# Patient Record
Sex: Male | Born: 1950
Health system: Southern US, Community
[De-identification: ages and names within clinical notes are randomized; demographics above are authoritative.]

## PROBLEM LIST (undated history)

## (undated) DIAGNOSIS — K219 Gastro-esophageal reflux disease without esophagitis: Secondary | ICD-10-CM

## (undated) DIAGNOSIS — Z87442 Personal history of urinary calculi: Secondary | ICD-10-CM

## (undated) DIAGNOSIS — D649 Anemia, unspecified: Secondary | ICD-10-CM

## (undated) DIAGNOSIS — E785 Hyperlipidemia, unspecified: Secondary | ICD-10-CM

## (undated) HISTORY — PX: ESOPHAGOGASTRODUODENOSCOPY ENDOSCOPY: SHX5814

## (undated) HISTORY — PX: APPENDECTOMY: SHX54

## (undated) HISTORY — PX: UPPER GASTROINTESTINAL ENDOSCOPY: SHX188

## (undated) HISTORY — PX: TONSILLECTOMY: SUR1361

## (undated) HISTORY — PX: COLONOSCOPY: SHX174

## (undated) HISTORY — DX: Hyperlipidemia, unspecified: E78.5

## (undated) HISTORY — PX: KIDNEY STONE SURGERY: SHX686

---

## 2014-01-28 ENCOUNTER — Other Ambulatory Visit: Payer: Self-pay

## 2014-01-28 ENCOUNTER — Telehealth: Payer: Self-pay

## 2014-01-28 DIAGNOSIS — K31819 Angiodysplasia of stomach and duodenum without bleeding: Secondary | ICD-10-CM

## 2014-01-28 NOTE — Telephone Encounter (Signed)
EGD scheduled, pt instructed and medications reviewed.  Patient instructions mailed to home.  Patient to call with any questions or concerns.  

## 2014-01-28 NOTE — Telephone Encounter (Signed)
EGD APC 02/26/14

## 2014-02-12 ENCOUNTER — Encounter (HOSPITAL_COMMUNITY): Payer: Self-pay | Admitting: *Deleted

## 2014-02-26 ENCOUNTER — Encounter (HOSPITAL_COMMUNITY)
Admission: RE | Disposition: A | Discharge status: Home / Self Care | Payer: Self-pay | Source: Ambulatory Visit | Attending: Gastroenterology

## 2014-02-26 ENCOUNTER — Ambulatory Visit (HOSPITAL_COMMUNITY): Payer: BLUE CROSS/BLUE SHIELD | Admitting: Registered Nurse

## 2014-02-26 ENCOUNTER — Encounter (HOSPITAL_COMMUNITY): Payer: Self-pay

## 2014-02-26 ENCOUNTER — Telehealth: Payer: Self-pay

## 2014-02-26 ENCOUNTER — Ambulatory Visit (HOSPITAL_COMMUNITY)
Admission: RE | Admit: 2014-02-26 | Discharge: 2014-02-26 | Disposition: A | Discharge status: Home / Self Care | Payer: BLUE CROSS/BLUE SHIELD | Source: Ambulatory Visit | Attending: Gastroenterology | Admitting: Gastroenterology

## 2014-02-26 DIAGNOSIS — K219 Gastro-esophageal reflux disease without esophagitis: Secondary | ICD-10-CM | POA: Diagnosis not present

## 2014-02-26 DIAGNOSIS — Z87891 Personal history of nicotine dependence: Secondary | ICD-10-CM | POA: Diagnosis not present

## 2014-02-26 DIAGNOSIS — Z87442 Personal history of urinary calculi: Secondary | ICD-10-CM | POA: Insufficient documentation

## 2014-02-26 DIAGNOSIS — D649 Anemia, unspecified: Secondary | ICD-10-CM | POA: Insufficient documentation

## 2014-02-26 DIAGNOSIS — K31819 Angiodysplasia of stomach and duodenum without bleeding: Secondary | ICD-10-CM | POA: Insufficient documentation

## 2014-02-26 DIAGNOSIS — Z79899 Other long term (current) drug therapy: Secondary | ICD-10-CM | POA: Diagnosis not present

## 2014-02-26 HISTORY — DX: Personal history of urinary calculi: Z87.442

## 2014-02-26 HISTORY — PX: HOT HEMOSTASIS: SHX5433

## 2014-02-26 HISTORY — DX: Anemia, unspecified: D64.9

## 2014-02-26 HISTORY — PX: ESOPHAGOGASTRODUODENOSCOPY (EGD) WITH PROPOFOL: SHX5813

## 2014-02-26 HISTORY — DX: Gastro-esophageal reflux disease without esophagitis: K21.9

## 2014-02-26 SURGERY — ESOPHAGOGASTRODUODENOSCOPY (EGD) WITH PROPOFOL
Anesthesia: Monitor Anesthesia Care

## 2014-02-26 MED ORDER — MIDAZOLAM HCL 2 MG/2ML IJ SOLN
INTRAMUSCULAR | Status: AC
  Filled 2014-02-26: qty 2

## 2014-02-26 MED ORDER — PROPOFOL 10 MG/ML IV BOLUS
INTRAVENOUS | Status: AC
  Filled 2014-02-26: qty 20

## 2014-02-26 MED ORDER — MIDAZOLAM HCL 5 MG/5ML IJ SOLN
INTRAMUSCULAR | Status: DC | PRN
  Administered 2014-02-26: 2 mg via INTRAVENOUS

## 2014-02-26 MED ORDER — LACTATED RINGERS IV SOLN
INTRAVENOUS | Status: DC
  Administered 2014-02-26: 1000 mL via INTRAVENOUS

## 2014-02-26 MED ORDER — KETAMINE HCL 10 MG/ML IJ SOLN
INTRAMUSCULAR | Status: AC
  Filled 2014-02-26: qty 1

## 2014-02-26 MED ORDER — SODIUM CHLORIDE 0.9 % IV SOLN: INTRAVENOUS | Status: DC

## 2014-02-26 MED ORDER — PROPOFOL INFUSION 10 MG/ML OPTIME
INTRAVENOUS | Status: DC | PRN
  Administered 2014-02-26: 120 ug/kg/min via INTRAVENOUS

## 2014-02-26 MED ORDER — LIDOCAINE HCL (CARDIAC) 20 MG/ML IV SOLN
INTRAVENOUS | Status: DC | PRN
  Administered 2014-02-26: 100 mg via INTRAVENOUS

## 2014-02-26 MED ORDER — MIDAZOLAM HCL 2 MG/2ML IJ SOLN: 0.5000 mg | Freq: Once | INTRAMUSCULAR | Status: DC | PRN

## 2014-02-26 MED ORDER — LIDOCAINE HCL (CARDIAC) 20 MG/ML IV SOLN
INTRAVENOUS | Status: AC
  Filled 2014-02-26: qty 5

## 2014-02-26 MED ORDER — KETAMINE HCL 10 MG/ML IJ SOLN
INTRAMUSCULAR | Status: DC | PRN
  Administered 2014-02-26: 20 mg via INTRAVENOUS

## 2014-02-26 MED ORDER — ACETAMINOPHEN 325 MG PO TABS
325.0000 mg | ORAL_TABLET | ORAL | Status: DC | PRN
  Filled 2014-02-26: qty 2

## 2014-02-26 MED ORDER — MEPERIDINE HCL 100 MG/ML IJ SOLN: 6.2500 mg | INTRAMUSCULAR | Status: DC | PRN

## 2014-02-26 MED ORDER — FENTANYL CITRATE 0.05 MG/ML IJ SOLN: 25.0000 ug | INTRAMUSCULAR | Status: DC | PRN

## 2014-02-26 MED ORDER — PROMETHAZINE HCL 25 MG/ML IJ SOLN: 6.2500 mg | INTRAMUSCULAR | Status: DC | PRN

## 2014-02-26 MED ORDER — ACETAMINOPHEN 160 MG/5ML PO SOLN
325.0000 mg | ORAL | Status: DC | PRN
  Filled 2014-02-26: qty 20.3

## 2014-02-26 SURGICAL SUPPLY — 14 items

## 2014-02-26 NOTE — Op Note (Signed)
[facility] [place], [address]   ENDOSCOPY PROCEDURE REPORT  PATIENT: Charles Carr, Charles Carr  MR#: [id number] BIRTHDATE: [birth year] , 63  yrs. old GENDER: male ENDOSCOPIST: Milus Banister, MD REFERRED BY:  Kyra Leyland, M.D. PROCEDURE DATE:  02/26/2014 PROCEDURE:  EGD w/ ablation ASA CLASS:     Class II INDICATIONS:  IDA, recent EGD and colonoscopy with Dr.  Lyda Jester revealed GAVE; has required several iron infusions. MEDICATIONS: Monitored anesthesia care TOPICAL ANESTHETIC: none  DESCRIPTION OF PROCEDURE: After the risks benefits and alternatives of the procedure were thoroughly explained, informed consent was obtained.  The Pentax Gastroscope [id number] endoscope was introduced through the mouth and advanced to the second portion of the duodenum , Without limitations.  The instrument was slowly withdrawn as the mucosa was fully examined.  There was typical appearing, cherry red GAVE in distal stomach. Light touches with the gastroscope caused bleeding.  The area was treated with application of APC with good results.  The examination was otherwise normal.  Retroflexed views revealed no abnormalities. The scope was then withdrawn from the patient and the procedure completed.  COMPLICATIONS: There were no immediate complications.  ENDOSCOPIC IMPRESSION: There was typical appearing, cherry red GAVE in distal stomach. Light touches with the gastroscope caused bleeding.  The area was treated with application of APC with good results.  The examination was otherwise normal  RECOMMENDATIONS: My office will schedule repeat EGD for evaluation and repeat APC treatment if needed in 2 months.   eSigned:  Milus Banister, MD 02/26/2014 9:17 AM

## 2014-02-26 NOTE — Progress Notes (Signed)
Pt c/o fullness and discomfort in the mid epigastric area. Rates 4/10. Notified Dr. Ardis Hughs. Stated related to the gas given during the procedure. Pt encouraged to pass gas and increase mobility today. Also instructed to take tylenol prn. Pt verbalized understanding. SmondayRn

## 2014-02-26 NOTE — Telephone Encounter (Signed)
Copy sent to Dr Lyda Jester and staff message sent to set up procedure

## 2014-02-26 NOTE — Transfer of Care (Signed)
Immediate Anesthesia Transfer of Care Note  Patient: Charles Carr  Procedure(s) Performed: Procedure(s) with comments: ESOPHAGOGASTRODUODENOSCOPY (EGD) WITH PROPOFOL (N/A) - APC HOT HEMOSTASIS (ARGON PLASMA COAGULATION/BICAP) (N/A)  Patient Location: PACU and Endoscopy Unit  Anesthesia Type:MAC  Level of Consciousness: awake, alert , oriented and patient cooperative  Airway & Oxygen Therapy: Patient Spontanous Breathing and Patient connected to nasal cannula oxygen  Post-op Assessment: Report given to PACU RN  Post vital signs: Reviewed and stable  Complications: No apparent anesthesia complications

## 2014-02-26 NOTE — Discharge Instructions (Signed)
YOU HAD AN ENDOSCOPIC PROCEDURE TODAY: Refer to the procedure report that was given to you for any specific questions about what was found during the examination.  If the procedure report does not answer your questions, please call your gastroenterologist to clarify. ° °YOU SHOULD EXPECT: Some feelings of bloating in the abdomen. Passage of more gas than usual.  Walking can help get rid of the air that was put into your GI tract during the procedure and reduce the bloating. If you had a lower endoscopy (such as a colonoscopy or flexible sigmoidoscopy) you may notice spotting of blood in your stool or on the toilet paper.  ° °DIET: Your first meal following the procedure should be a light meal and then it is ok to progress to your normal diet.  A half-sandwich or bowl of soup is an example of a good first meal.  Heavy or fried foods are harder to digest and may make you feel nasueas or bloated.  Drink plenty of fluids but you should avoid alcoholic beverages for 24 hours. ° °ACTIVITY: Your care partner should take you home directly after the procedure.  You should plan to take it easy, moving slowly for the rest of the day.  You can resume normal activity the day after the procedure however you should NOT DRIVE or use heavy machinery for 24 hours (because of the sedation medicines used during the test).   ° °SYMPTOMS TO REPORT IMMEDIATELY  °A gastroenterologist can be reached at any hour.  Please call your doctor's office for any of the following symptoms: ° °· Following lower endoscopy (colonoscopy, flexible sigmoidoscopy) ° Excessive amounts of blood in the stool ° Significant tenderness, worsening of abdominal pains ° Swelling of the abdomen that is new, acute ° Fever of 100° or higher °· Following upper endoscopy (EGD, EUS, ERCP) ° Vomiting of blood or coffee ground material ° New, significant abdominal pain ° New, significant chest pain or pain under the shoulder blades ° Painful or persistently difficult  swallowing ° New shortness of breath ° Black, tarry-looking stools ° °FOLLOW UP: °If any biopsies were taken you will be contacted by phone or by letter within the next 1-3 weeks.  Call your gastroenterologist if you have not heard about the biopsies in 3 weeks.  °Please also call your gastroenterologist's office with any specific questions about appointments or follow up tests. ° ° °Esophagogastroduodenoscopy °Care After °Refer to this sheet in the next few weeks. These instructions provide you with information on caring for yourself after your procedure. Your caregiver may also give you more specific instructions. Your treatment has been planned according to current medical practices, but problems sometimes occur. Call your caregiver if you have any problems or questions after your procedure.  °HOME CARE INSTRUCTIONS °· Do not eat or drink anything until the numbing medicine (local anesthetic) has worn off and your gag reflex has returned. You will know that the local anesthetic has worn off when you can swallow comfortably. °· Do not drive for 12 hours after the procedure or as directed by your caregiver. °· Only take medicines as directed by your caregiver. °SEEK MEDICAL CARE IF:  °· You cannot stop coughing. °· You are not urinating at all or less than usual. °SEEK IMMEDIATE MEDICAL CARE IF: °· You have difficulty swallowing. °· You cannot eat or drink. °· You have worsening throat or chest pain. °· You have dizziness, lightheadedness, or you faint. °· You have nausea or vomiting. °· You have   chills. °· You have a fever. °· You have severe abdominal pain. °· You have black, tarry, or bloody stools. °Document Released: 01/17/2012 Document Reviewed: 01/17/2012 °ExitCare® Patient Information ©2015 [employer]. This information is not intended to replace advice given to you by your health care provider. Make sure you discuss any questions you have with your health care provider. ° ° °Conscious Sedation, Adult,  Care After °Refer to this sheet in the next few weeks. These instructions provide you with information on caring for yourself after your procedure. Your health care provider may also give you more specific instructions. Your treatment has been planned according to current medical practices, but problems sometimes occur. Call your health care provider if you have any problems or questions after your procedure. °WHAT TO EXPECT AFTER THE PROCEDURE  °After your procedure: °· You may feel sleepy, clumsy, and have poor balance for several hours. °· Vomiting may occur if you eat too soon after the procedure. °HOME CARE INSTRUCTIONS °· Do not participate in any activities where you could become injured for at least 24 hours. Do not: °¨ Drive. °¨ Swim. °¨ Ride a bicycle. °¨ Operate heavy machinery. °¨ Cook. °¨ Use power tools. °¨ Climb ladders. °¨ Work from a high place. °· Do not make important decisions or sign legal documents until you are improved. °· If you vomit, drink water, juice, or soup when you can drink without vomiting. Make sure you have little or no nausea before eating solid foods. °· Only take over-the-counter or prescription medicines for pain, discomfort, or fever as directed by your health care provider. °· Make sure you and your family fully understand everything about the medicines given to you, including what side effects may occur. °· You should not drink alcohol, take sleeping pills, or take medicines that cause drowsiness for at least 24 hours. °· If you smoke, do not smoke without supervision. °· If you are feeling better, you may resume normal activities 24 hours after you were sedated. °· Keep all appointments with your health care provider. °SEEK MEDICAL CARE IF: °· Your skin is pale or bluish in color. °· You continue to feel nauseous or vomit. °· Your pain is getting worse and is not helped by medicine. °· You have bleeding or swelling. °· You are still sleepy or feeling clumsy after 24  hours. °SEEK IMMEDIATE MEDICAL CARE IF: °· You develop a rash. °· You have difficulty breathing. °· You develop any type of allergic problem. °· You have a fever. °MAKE SURE YOU: °· Understand these instructions. °· Will watch your condition. °· Will get help right away if you are not doing well or get worse. °Document Released: 11/20/2012 Document Reviewed: 11/20/2012 °ExitCare® Patient Information ©2015 [employer]. This information is not intended to replace advice given to you by your health care provider. Make sure you discuss any questions you have with your health care provider. ° °

## 2014-02-26 NOTE — Anesthesia Preprocedure Evaluation (Addendum)
Anesthesia Evaluation  Patient identified by MRN, date of birth, ID band Patient awake    Reviewed: Allergy & Precautions, H&P , Patient's Chart, lab work & pertinent test results, reviewed documented beta blocker date and time   History of Anesthesia Complications Negative for: history of anesthetic complications  Airway Mallampati: II  TM Distance: >3 FB Neck ROM: full    Dental   Pulmonary former smoker,  breath sounds clear to auscultation        Cardiovascular Exercise Tolerance: Good Rhythm:regular Rate:Normal     Neuro/Psych negative psych ROS   GI/Hepatic GERD-  Controlled,  Endo/Other    Renal/GU      Musculoskeletal   Abdominal   Peds  Hematology  (+) anemia ,   Anesthesia Other Findings   Reproductive/Obstetrics                            Anesthesia Physical Anesthesia Plan  ASA: II  Anesthesia Plan: MAC   Post-op Pain Management:    Induction:   Airway Management Planned: Nasal Cannula  Additional Equipment:   Intra-op Plan:   Post-operative Plan:   Informed Consent: I have reviewed the patients History and Physical, chart, labs and discussed the procedure including the risks, benefits and alternatives for the proposed anesthesia with the patient or authorized representative who has indicated his/her understanding and acceptance.   Dental Advisory Given  Plan Discussed with: CRNA, Surgeon and Anesthesiologist  Anesthesia Plan Comments:        Anesthesia Quick Evaluation

## 2014-02-26 NOTE — Telephone Encounter (Signed)
-----   Message from Milus Banister, MD sent at 02/26/2014  9:18 AM EST ----- Genese Quebedeaux, Can you send a copy of today's EGD to Dr. Lum Keas. Also the patient will need repeat EGD in 2 months with APC at [facility] (MAC sedation) on an eus THursday, for GAVE.  Thanks

## 2014-02-26 NOTE — Anesthesia Postprocedure Evaluation (Signed)
  Anesthesia Post Note  Patient: Charles Carr  Procedure(s) Performed: Procedure(s) (LRB): ESOPHAGOGASTRODUODENOSCOPY (EGD) WITH PROPOFOL (N/A) HOT HEMOSTASIS (ARGON PLASMA COAGULATION/BICAP) (N/A)  Anesthesia type: MAC  Patient location: PACU  Post pain: Pain level controlled  Post assessment: Post-op Vital signs reviewed  Last Vitals:  Filed Vitals:   02/26/14 0920  BP:   Pulse: 66  Temp: 36.6 C  Resp: 18    Post vital signs: Reviewed  Level of consciousness: sedated  Complications: No apparent anesthesia complications

## 2014-02-26 NOTE — H&P (Signed)
  HPI: This is a man with anemia, recently found to have GAVE by Dr. Lyda Jester;  Has requried 3-4 infusions of iron. Has not tried oral iron.  Colonoscopy, EGD were otherwise normal.     Past Medical History  Diagnosis Date  . History of kidney stones   . GERD (gastroesophageal reflux disease)   . Anemia     iron infusion 1'15     Past Surgical History  Procedure Laterality Date  . Esophagogastroduodenoscopy endoscopy      [facility]  . Appendectomy    . Tonsillectomy    . Kidney stone surgery      x2    Current Facility-Administered Medications  Medication Dose Route Frequency Provider Last Rate Last Dose  . 0.9 %  sodium chloride infusion   Intravenous Continuous Milus Banister, MD      . acetaminophen (TYLENOL) tablet 325-650 mg  325-650 mg Oral Q4H PRN Rudean Curt, MD       Or  . acetaminophen (TYLENOL) solution 325-650 mg  325-650 mg Oral Q4H PRN Rudean Curt, MD      . fentaNYL (SUBLIMAZE) injection 25-50 mcg  25-50 mcg Intravenous Q5 min PRN Rudean Curt, MD      . lactated ringers infusion   Intravenous Continuous Milus Banister, MD 50 mL/hr at 02/26/14 0835 1,000 mL at 02/26/14 0835  . meperidine (DEMEROL) injection 6.25-12.5 mg  6.25-12.5 mg Intravenous Q5 min PRN Rudean Curt, MD      . midazolam (VERSED) injection 0.5-2 mg  0.5-2 mg Intravenous Once PRN Rudean Curt, MD      . promethazine (PHENERGAN) injection 6.25-12.5 mg  6.25-12.5 mg Intravenous Q15 min PRN Rudean Curt, MD        Allergies as of 01/28/2014  . (Not on File)    History reviewed. No pertinent family history.  History   Social History  . Marital Status: Married    Spouse Name: N/A    Number of Children: N/A  . Years of Education: N/A   Occupational History  . Not on file.   Social History Main Topics  . Smoking status: Former Smoker    Quit date: 02/13/1988  . Smokeless tobacco: Not on file  . Alcohol Use: No  . Drug Use: No  . Sexual Activity: Not  on file   Other Topics Concern  . Not on file   Social History Narrative      Physical Exam: BP 145/83 mmHg  Pulse 59  Temp(Src) 98.5 F (36.9 C) (Oral)  Resp 14  Ht 5\' 8"  (1.727 m)  Wt 200 lb (90.719 kg)  BMI 30.42 kg/m2  SpO2 98% Constitutional: generally well-appearing Psychiatric: alert and oriented x3 Abdomen: soft, nontender, nondistended, no obvious ascites, no peritoneal signs, normal bowel sounds     Assessment and plan: 64 y.o. male with GAVE, IDA  Here for EGD with application of APC

## 2014-02-26 NOTE — Anesthesia Procedure Notes (Signed)
Procedure Name: MAC Date/Time: 02/26/2014 8:48 AM Performed by: Carleene Cooper A Pre-anesthesia Checklist: Patient identified, Emergency Drugs available, Suction available, Patient being monitored and Timeout performed Patient Re-evaluated:Patient Re-evaluated prior to inductionOxygen Delivery Method: Nasal cannula Dental Injury: Teeth and Oropharynx as per pre-operative assessment

## 2014-02-27 ENCOUNTER — Encounter (HOSPITAL_COMMUNITY): Payer: Self-pay | Admitting: Gastroenterology

## 2014-04-06 ENCOUNTER — Telehealth: Payer: Self-pay

## 2014-04-06 NOTE — Telephone Encounter (Signed)
-----   Message from Barron Alvine, Leith sent at 02/26/2014 11:36 AM EST ----- EGD in 2 months with APC at [facility] (MAC sedation) on an eus THursday, for GAVE

## 2014-04-09 ENCOUNTER — Telehealth: Payer: Self-pay

## 2014-04-09 ENCOUNTER — Other Ambulatory Visit: Payer: Self-pay

## 2014-04-09 DIAGNOSIS — K31819 Angiodysplasia of stomach and duodenum without bleeding: Secondary | ICD-10-CM

## 2014-04-09 NOTE — Telephone Encounter (Signed)
EGD scheduled, pt instructed and medications reviewed.  Patient instructions mailed to home.  Patient to call with any questions or concerns.  

## 2014-04-09 NOTE — Telephone Encounter (Signed)
Message from Barron Alvine, [place] sent at 02/26/2014 11:36 AM EST ----- EGD in 2 months with APC at [facility] (MAC sedation) on an eus THursday, for GAVE

## 2014-04-13 ENCOUNTER — Encounter (HOSPITAL_COMMUNITY): Payer: Self-pay | Admitting: *Deleted

## 2014-04-16 ENCOUNTER — Encounter (HOSPITAL_COMMUNITY): Payer: Self-pay | Admitting: *Deleted

## 2014-04-16 ENCOUNTER — Encounter (HOSPITAL_COMMUNITY)
Admission: RE | Disposition: A | Discharge status: Home / Self Care | Payer: Self-pay | Source: Ambulatory Visit | Attending: Gastroenterology

## 2014-04-16 ENCOUNTER — Ambulatory Visit (HOSPITAL_COMMUNITY): Payer: BLUE CROSS/BLUE SHIELD | Admitting: Certified Registered"

## 2014-04-16 ENCOUNTER — Ambulatory Visit (HOSPITAL_COMMUNITY)
Admission: RE | Admit: 2014-04-16 | Discharge: 2014-04-16 | Disposition: A | Discharge status: Home / Self Care | Payer: BLUE CROSS/BLUE SHIELD | Source: Ambulatory Visit | Attending: Gastroenterology | Admitting: Gastroenterology

## 2014-04-16 DIAGNOSIS — K219 Gastro-esophageal reflux disease without esophagitis: Secondary | ICD-10-CM | POA: Insufficient documentation

## 2014-04-16 DIAGNOSIS — Z9049 Acquired absence of other specified parts of digestive tract: Secondary | ICD-10-CM | POA: Insufficient documentation

## 2014-04-16 DIAGNOSIS — Z87891 Personal history of nicotine dependence: Secondary | ICD-10-CM | POA: Insufficient documentation

## 2014-04-16 DIAGNOSIS — D649 Anemia, unspecified: Secondary | ICD-10-CM | POA: Insufficient documentation

## 2014-04-16 DIAGNOSIS — K31819 Angiodysplasia of stomach and duodenum without bleeding: Secondary | ICD-10-CM | POA: Diagnosis present

## 2014-04-16 HISTORY — PX: ESOPHAGOGASTRODUODENOSCOPY (EGD) WITH PROPOFOL: SHX5813

## 2014-04-16 SURGERY — ESOPHAGOGASTRODUODENOSCOPY (EGD) WITH PROPOFOL
Anesthesia: Monitor Anesthesia Care

## 2014-04-16 MED ORDER — LACTATED RINGERS IV SOLN
INTRAVENOUS | Status: DC
  Administered 2014-04-16: 1000 mL via INTRAVENOUS

## 2014-04-16 MED ORDER — SODIUM CHLORIDE 0.9 % IV SOLN: INTRAVENOUS | Status: DC

## 2014-04-16 MED ORDER — PROPOFOL 10 MG/ML IV BOLUS
INTRAVENOUS | Status: DC | PRN
  Administered 2014-04-16: 50 mg via INTRAVENOUS
  Administered 2014-04-16 (×2): 100 mg via INTRAVENOUS

## 2014-04-16 MED ORDER — PROPOFOL 10 MG/ML IV BOLUS
INTRAVENOUS | Status: AC
  Filled 2014-04-16: qty 20

## 2014-04-16 SURGICAL SUPPLY — 14 items

## 2014-04-16 NOTE — Op Note (Signed)
[facility] [place], [address]   ENDOSCOPY PROCEDURE REPORT  PATIENT: Charles Carr, Charles Carr  MR#: [id number] BIRTHDATE: 06-01-1950 , 74  yrs. old GENDER: male ENDOSCOPIST: Milus Banister, MD REFERRED BY:  Kyra Leyland, M.D. PROCEDURE DATE:  04/16/2014 PROCEDURE:  EGD w/ control of bleeding ASA CLASS:     Class III INDICATIONS:  known GAVE causing signficant anemia.  EGD 02/2014 with APC treatment.Marland Kitchen MEDICATIONS: Monitored anesthesia care TOPICAL ANESTHETIC: none  DESCRIPTION OF PROCEDURE: After the risks benefits and alternatives of the procedure were thoroughly explained, informed consent was obtained.  The Pentax Gastroscope [id number] endoscope was introduced through the mouth and advanced to the second portion of the duodenum , Without limitations.  The instrument was slowly withdrawn as the mucosa was fully examined.   The previously noted and treated GAVE changes were much improved today.  There were still a few GAVE type AVMs arrayed in typical linear pattern in pre-pylorus.  This was treated with application of APC with very good result.  The examination was othewrise normal.  Retroflexed views revealed no abnormalities.     The scope was then withdrawn from the patient and the procedure completed. COMPLICATIONS: There were no immediate complications.  ENDOSCOPIC IMPRESSION: The previously noted and treated GAVE changes were much improved today.  There were still a few GAVE type AVMs arrayed in typical linear pattern in pre-pylorus.  This was treated with application of APC with very good result.  The examination was othewrise normal   RECOMMENDATIONS: Follow blood counts per primary GI or PCP, EGD with APC can be repeated as needed.  eSigned:  Milus Banister, MD 04/16/2014 4:09 PM

## 2014-04-16 NOTE — H&P (Signed)
  HPI: This is a man with GAVE, here for repeat EGD    Past Medical History  Diagnosis Date  . History of kidney stones   . GERD (gastroesophageal reflux disease)   . Anemia     iron infusion 1'15     Past Surgical History  Procedure Laterality Date  . Esophagogastroduodenoscopy endoscopy      [facility]  . Appendectomy    . Tonsillectomy    . Kidney stone surgery      x2  . Esophagogastroduodenoscopy (egd) with propofol N/A 02/26/2014    Procedure: ESOPHAGOGASTRODUODENOSCOPY (EGD) WITH PROPOFOL;  Surgeon: Milus Banister, MD;  Location: [facility] ENDOSCOPY;  Service: Endoscopy;  Laterality: N/A;  APC  . Hot hemostasis N/A 02/26/2014    Procedure: HOT HEMOSTASIS (ARGON PLASMA COAGULATION/BICAP);  Surgeon: Milus Banister, MD;  Location: [facility];  Service: Endoscopy;  Laterality: N/A;    Current Facility-Administered Medications  Medication Dose Route Frequency Provider Last Rate Last Dose  . 0.9 %  sodium chloride infusion   Intravenous Continuous Milus Banister, MD      . lactated ringers infusion   Intravenous Continuous Milus Banister, MD        Allergies as of 04/09/2014  . (No Known Allergies)    History reviewed. No pertinent family history.  History   Social History  . Marital Status: Married    Spouse Name: N/A  . Number of Children: N/A  . Years of Education: N/A   Occupational History  . Not on file.   Social History Main Topics  . Smoking status: Former Smoker    Quit date: 02/13/1988  . Smokeless tobacco: Not on file  . Alcohol Use: No  . Drug Use: No  . Sexual Activity: Not on file   Other Topics Concern  . Not on file   Social History Narrative      Physical Exam: BP 163/80 mmHg  Temp(Src) 97.7 F (36.5 C) (Oral)  Resp 19  Ht 5\' 8"  (1.727 m)  Wt 200 lb (90.719 kg)  BMI 30.42 kg/m2  SpO2 97% Constitutional: generally well-appearing Psychiatric: alert and oriented x3 Abdomen: soft, nontender, nondistended, no obvious  ascites, no peritoneal signs, normal bowel sounds     Assessment and plan: 64 y.o. male with GAVE  Repeat EGD today, +/- APC

## 2014-04-16 NOTE — Anesthesia Preprocedure Evaluation (Signed)
Anesthesia Evaluation  Patient identified by MRN, date of birth, ID band Patient awake    Reviewed: Allergy & Precautions, NPO status , Patient's Chart, lab work & pertinent test results  Airway        Dental   Pulmonary former smoker,          Cardiovascular     Neuro/Psych    GI/Hepatic GERD-  ,  Endo/Other    Renal/GU      Musculoskeletal   Abdominal   Peds  Hematology   Anesthesia Other Findings GI bleed  Reproductive/Obstetrics                             Anesthesia Physical Anesthesia Plan  ASA: I  Anesthesia Plan: MAC   Post-op Pain Management:    Induction: Intravenous  Airway Management Planned: Mask  Additional Equipment:   Intra-op Plan:   Post-operative Plan:   Informed Consent: I have reviewed the patients History and Physical, chart, labs and discussed the procedure including the risks, benefits and alternatives for the proposed anesthesia with the patient or authorized representative who has indicated his/her understanding and acceptance.     Plan Discussed with: CRNA, Anesthesiologist and Surgeon  Anesthesia Plan Comments:         Anesthesia Quick Evaluation

## 2014-04-16 NOTE — Transfer of Care (Signed)
Immediate Anesthesia Transfer of Care Note  Patient: Charles Carr  Procedure(s) Performed: Procedure(s) (LRB): ESOPHAGOGASTRODUODENOSCOPY (EGD) WITH PROPOFOL (N/A)  Patient Location: PACU  Anesthesia Type: MAC  Level of Consciousness: sedated, patient cooperative and responds to stimulation  Airway & Oxygen Therapy: Patient Spontanous Breathing and Patient connected to face mask oxgen  Post-op Assessment: Report given to PACU RN and Post -op Vital signs reviewed and stable  Post vital signs: Reviewed and stable  Complications: No apparent anesthesia complications

## 2014-04-16 NOTE — Anesthesia Postprocedure Evaluation (Signed)
  Anesthesia Post-op Note  Patient: Charles Carr  Procedure(s) Performed: Procedure(s): ESOPHAGOGASTRODUODENOSCOPY (EGD) WITH PROPOFOL (N/A)  Patient Location: PACU  Anesthesia Type:MAC  Level of Consciousness: awake, alert , oriented and patient cooperative  Airway and Oxygen Therapy: Patient Spontanous Breathing  Post-op Pain: none  Post-op Assessment: Post-op Vital signs reviewed, Patient's Cardiovascular Status Stable, Respiratory Function Stable, Patent Airway, No signs of Nausea or vomiting and Pain level controlled  Post-op Vital Signs: stable  Last Vitals:  Filed Vitals:   04/16/14 1619  BP: 134/74  Pulse: 67  Temp:   Resp: 22    Complications: No apparent anesthesia complications

## 2014-04-16 NOTE — Discharge Instructions (Signed)
YOU HAD AN ENDOSCOPIC PROCEDURE TODAY: Refer to the procedure report that was given to you for any specific questions about what was found during the examination.  If the procedure report does not answer your questions, please call your gastroenterologist to clarify. ° °YOU SHOULD EXPECT: Some feelings of bloating in the abdomen. Passage of more gas than usual.  Walking can help get rid of the air that was put into your GI tract during the procedure and reduce the bloating. If you had a lower endoscopy (such as a colonoscopy or flexible sigmoidoscopy) you may notice spotting of blood in your stool or on the toilet paper.  ° °DIET: Your first meal following the procedure should be a light meal and then it is ok to progress to your normal diet.  A half-sandwich or bowl of soup is an example of a good first meal.  Heavy or fried foods are harder to digest and may make you feel nasueas or bloated.  Drink plenty of fluids but you should avoid alcoholic beverages for 24 hours. ° °ACTIVITY: Your care partner should take you home directly after the procedure.  You should plan to take it easy, moving slowly for the rest of the day.  You can resume normal activity the day after the procedure however you should NOT DRIVE or use heavy machinery for 24 hours (because of the sedation medicines used during the test).   ° °SYMPTOMS TO REPORT IMMEDIATELY  °A gastroenterologist can be reached at any hour.  Please call your doctor's office for any of the following symptoms: ° °· Following lower endoscopy (colonoscopy, flexible sigmoidoscopy) ° Excessive amounts of blood in the stool ° Significant tenderness, worsening of abdominal pains ° Swelling of the abdomen that is new, acute ° Fever of 100° or higher °· Following upper endoscopy (EGD, EUS, ERCP) ° Vomiting of blood or coffee ground material ° New, significant abdominal pain ° New, significant chest pain or pain under the shoulder blades ° Painful or persistently difficult  swallowing ° New shortness of breath ° Black, tarry-looking stools ° °FOLLOW UP: °If any biopsies were taken you will be contacted by phone or by letter within the next 1-3 weeks.  Call your gastroenterologist if you have not heard about the biopsies in 3 weeks.  °Please also call your gastroenterologist's office with any specific questions about appointments or follow up tests. ° °Conscious Sedation, Adult, Care After °Refer to this sheet in the next few weeks. These instructions provide you with information on caring for yourself after your procedure. Your health care provider may also give you more specific instructions. Your treatment has been planned according to current medical practices, but problems sometimes occur. Call your health care provider if you have any problems or questions after your procedure. °WHAT TO EXPECT AFTER THE PROCEDURE  °After your procedure: °· You may feel sleepy, clumsy, and have poor balance for several hours. °· Vomiting may occur if you eat too soon after the procedure. °HOME CARE INSTRUCTIONS °· Do not participate in any activities where you could become injured for at least 24 hours. Do not: °¨ Drive. °¨ Swim. °¨ Ride a bicycle. °¨ Operate heavy machinery. °¨ Cook. °¨ Use power tools. °¨ Climb ladders. °¨ Work from a high place. °· Do not make important decisions or sign legal documents until you are improved. °· If you vomit, drink water, juice, or soup when you can drink without vomiting. Make sure you have little or no nausea before eating   solid foods. °· Only take over-the-counter or prescription medicines for pain, discomfort, or fever as directed by your health care provider. °· Make sure you and your family fully understand everything about the medicines given to you, including what side effects may occur. °· You should not drink alcohol, take sleeping pills, or take medicines that cause drowsiness for at least 24 hours. °· If you smoke, do not smoke without  supervision. °· If you are feeling better, you may resume normal activities 24 hours after you were sedated. °· Keep all appointments with your health care provider. °SEEK MEDICAL CARE IF: °· Your skin is pale or bluish in color. °· You continue to feel nauseous or vomit. °· Your pain is getting worse and is not helped by medicine. °· You have bleeding or swelling. °· You are still sleepy or feeling clumsy after 24 hours. °SEEK IMMEDIATE MEDICAL CARE IF: °· You develop a rash. °· You have difficulty breathing. °· You develop any type of allergic problem. °· You have a fever. °MAKE SURE YOU: °· Understand these instructions. °· Will watch your condition. °· Will get help right away if you are not doing well or get worse. °Document Released: 11/20/2012 Document Reviewed: 11/20/2012 °ExitCare® Patient Information ©2015 [employer]. This information is not intended to replace advice given to you by your health care provider. Make sure you discuss any questions you have with your health care provider. ° °

## 2014-04-17 ENCOUNTER — Encounter (HOSPITAL_COMMUNITY): Payer: Self-pay | Admitting: Gastroenterology

## 2015-09-09 DIAGNOSIS — H52223 Regular astigmatism, bilateral: Secondary | ICD-10-CM | POA: Diagnosis not present

## 2016-02-09 ENCOUNTER — Telehealth: Payer: Self-pay | Admitting: Gastroenterology

## 2016-02-09 NOTE — Telephone Encounter (Signed)
I called Charles Carr at [facility] and advised her that the pt is not a GI pt of Dr Ardis Hughs, he only completed procedures per referral from Dr Kyra Leyland.  I advised her to call that office and set the pt up an appt

## 2016-02-16 DIAGNOSIS — D500 Iron deficiency anemia secondary to blood loss (chronic): Secondary | ICD-10-CM | POA: Diagnosis not present

## 2016-02-16 DIAGNOSIS — K31819 Angiodysplasia of stomach and duodenum without bleeding: Secondary | ICD-10-CM | POA: Diagnosis not present

## 2016-02-18 DIAGNOSIS — K219 Gastro-esophageal reflux disease without esophagitis: Secondary | ICD-10-CM | POA: Diagnosis not present

## 2016-02-18 DIAGNOSIS — E611 Iron deficiency: Secondary | ICD-10-CM | POA: Diagnosis not present

## 2016-02-18 DIAGNOSIS — K31819 Angiodysplasia of stomach and duodenum without bleeding: Secondary | ICD-10-CM | POA: Diagnosis not present

## 2016-02-18 DIAGNOSIS — R233 Spontaneous ecchymoses: Secondary | ICD-10-CM | POA: Diagnosis not present

## 2016-02-18 DIAGNOSIS — D509 Iron deficiency anemia, unspecified: Secondary | ICD-10-CM | POA: Diagnosis not present

## 2016-03-02 DIAGNOSIS — R1013 Epigastric pain: Secondary | ICD-10-CM | POA: Diagnosis not present

## 2016-03-02 DIAGNOSIS — N200 Calculus of kidney: Secondary | ICD-10-CM | POA: Diagnosis not present

## 2016-03-02 DIAGNOSIS — R112 Nausea with vomiting, unspecified: Secondary | ICD-10-CM | POA: Diagnosis not present

## 2016-03-02 DIAGNOSIS — R531 Weakness: Secondary | ICD-10-CM | POA: Diagnosis not present

## 2016-03-02 DIAGNOSIS — R0602 Shortness of breath: Secondary | ICD-10-CM | POA: Diagnosis not present

## 2016-03-02 DIAGNOSIS — R100 Acute abdomen: Secondary | ICD-10-CM | POA: Diagnosis not present

## 2016-03-02 DIAGNOSIS — K529 Noninfective gastroenteritis and colitis, unspecified: Secondary | ICD-10-CM | POA: Diagnosis not present

## 2016-03-03 DIAGNOSIS — R1013 Epigastric pain: Secondary | ICD-10-CM | POA: Diagnosis not present

## 2017-02-07 DIAGNOSIS — R05 Cough: Secondary | ICD-10-CM | POA: Diagnosis not present

## 2017-05-17 DIAGNOSIS — H2513 Age-related nuclear cataract, bilateral: Secondary | ICD-10-CM | POA: Diagnosis not present

## 2017-05-17 DIAGNOSIS — H524 Presbyopia: Secondary | ICD-10-CM | POA: Diagnosis not present

## 2017-05-29 DIAGNOSIS — E785 Hyperlipidemia, unspecified: Secondary | ICD-10-CM | POA: Diagnosis not present

## 2017-05-29 DIAGNOSIS — M545 Low back pain: Secondary | ICD-10-CM | POA: Diagnosis not present

## 2017-05-29 DIAGNOSIS — Z125 Encounter for screening for malignant neoplasm of prostate: Secondary | ICD-10-CM | POA: Diagnosis not present

## 2017-05-29 DIAGNOSIS — Z0000 Encounter for general adult medical examination without abnormal findings: Secondary | ICD-10-CM | POA: Diagnosis not present

## 2017-06-05 DIAGNOSIS — K31819 Angiodysplasia of stomach and duodenum without bleeding: Secondary | ICD-10-CM | POA: Diagnosis not present

## 2017-06-12 ENCOUNTER — Encounter: Payer: Self-pay | Admitting: Physician Assistant

## 2017-06-21 DIAGNOSIS — M25462 Effusion, left knee: Secondary | ICD-10-CM | POA: Diagnosis not present

## 2017-06-29 ENCOUNTER — Encounter: Payer: Self-pay | Admitting: *Deleted

## 2017-07-02 ENCOUNTER — Encounter (INDEPENDENT_AMBULATORY_CARE_PROVIDER_SITE_OTHER): Payer: Self-pay

## 2017-07-02 ENCOUNTER — Encounter: Payer: Self-pay | Admitting: Physician Assistant

## 2017-07-02 ENCOUNTER — Ambulatory Visit: Payer: BLUE CROSS/BLUE SHIELD | Admitting: Physician Assistant

## 2017-07-02 DIAGNOSIS — K219 Gastro-esophageal reflux disease without esophagitis: Secondary | ICD-10-CM | POA: Diagnosis not present

## 2017-07-02 DIAGNOSIS — Z860100 Personal history of colon polyps, unspecified: Secondary | ICD-10-CM

## 2017-07-02 DIAGNOSIS — D500 Iron deficiency anemia secondary to blood loss (chronic): Secondary | ICD-10-CM

## 2017-07-02 DIAGNOSIS — D509 Iron deficiency anemia, unspecified: Secondary | ICD-10-CM | POA: Insufficient documentation

## 2017-07-02 DIAGNOSIS — K31819 Angiodysplasia of stomach and duodenum without bleeding: Secondary | ICD-10-CM | POA: Insufficient documentation

## 2017-07-02 DIAGNOSIS — Z87442 Personal history of urinary calculi: Secondary | ICD-10-CM

## 2017-07-02 HISTORY — DX: Personal history of colon polyps, unspecified: Z86.0100

## 2017-07-02 HISTORY — DX: Angiodysplasia of stomach and duodenum without bleeding: K31.819

## 2017-07-02 HISTORY — DX: Personal history of urinary calculi: Z87.442

## 2017-07-02 HISTORY — DX: Iron deficiency anemia, unspecified: D50.9

## 2017-07-02 HISTORY — DX: Personal history of colonic polyps: Z86.010

## 2017-07-02 NOTE — H&P (View-Only) (Signed)
Subjective:    Patient ID: Charles Carr, male    DOB: 12/13/1950, 67 y.o.   MRN: [id number]  HPI Christie is a pleasant 67 year old white male, known to Dr. Ardis Hughs from previous EGDs done in 2016.  Patient is referred back today by Lucita Lora, NP/[facility] for further evaluation of iron deficiency. Patient is also known to Dr. Lyda Jester who had initially referred him in 2016 for EGD after finding of GAVE.  He underwent EGD with APC in January 2016 per Dr. Ardis Hughs and this was repeated again in March 2016 with further ablation.  We have not seen him since. Patient says he had recent labs done and was found to be iron deficient.  Labs in April 2019 showed serum iron of 22, ferritin of 9C met unremarkable.  I do not see a CBC. His only symptom is fatigue, but he says this is not as bad as it was in 2016.  He does have chronic GERD as well which is controlled with Dexilant 60 mg p.o. daily.  He has no complaints of abdominal discomfort, no dysphagia or odynophagia.  No changes in bowel habits and no melena or hematochezia. He had not been maintained on chronic oral iron but was recently restarted on an iron supplement twice daily. He does not use any regular aspirin or NSAIDs.  Patient is also had prior colonoscopies by Dr. Lyda Jester, he believes the last was about 3 years ago and he did have a polyp removed.  He is not certain when he was to have follow-up. Other medical problems include anxiety, history of kidney stones and prior appendectomy.  Review of Systems Pertinent positive and negative review of systems were noted in the above HPI section.  All other review of systems was otherwise negative.  Outpatient Encounter Medications as of 07/02/2017  Medication Sig  . acetaminophen (TYLENOL) 500 MG tablet Take by mouth every 6 (six) hours as needed for mild pain or headache.   . ALPRAZolam (XANAX) 0.25 MG tablet Take 0.25 mg by mouth daily as needed for anxiety.   . AMBULATORY NON  FORMULARY MEDICATION Medication Name: Mega Red Vitamin three per day  . dexlansoprazole (DEXILANT) 60 MG capsule Take 60 mg by mouth daily.  . ferrous sulfate (FEROSUL) 325 (65 FE) MG tablet Take 325 mg by mouth 2 (two) times daily with a meal.  . [DISCONTINUED] calcium carbonate (OS-CAL) 600 MG TABS tablet Take 600 mg by mouth 2 (two) times daily with a meal.  . [DISCONTINUED] Cholecalciferol 400 units CAPS Take 1 capsule by mouth daily.  . [DISCONTINUED] pantoprazole (PROTONIX) 40 MG tablet Take 40 mg by mouth daily.  . [DISCONTINUED] sertraline (ZOLOFT) 50 MG tablet Take 50 mg by mouth daily.  . [DISCONTINUED] spironolactone (ALDACTONE) 25 MG tablet Take 25 mg by mouth daily.  . [DISCONTINUED] valsartan (DIOVAN) 320 MG tablet Take 320 mg by mouth daily.   No facility-administered encounter medications on file as of 07/02/2017.    No Known Allergies Patient Active Problem List   Diagnosis Date Noted  . GAVE (gastric antral vascular ectasia) 07/02/2017  . Iron deficiency anemia 07/02/2017  . History of colonic polyps 07/02/2017  . Personal history of kidney stones 07/02/2017   Social History   Socioeconomic History  . Marital status: Married    Spouse name: Not on file  . Number of children: Not on file  . Years of education: Not on file  . Highest education level: Not on file  Occupational History  .  Not on file  Social Needs  . Financial resource strain: Not on file  . Food insecurity:    Worry: Not on file    Inability: Not on file  . Transportation needs:    Medical: Not on file    Non-medical: Not on file  Tobacco Use  . Smoking status: Former Smoker    Last attempt to quit: 02/13/1988    Years since quitting: 29.4  Substance and Sexual Activity  . Alcohol use: No  . Drug use: No  . Sexual activity: Not on file  Lifestyle  . Physical activity:    Days per week: Not on file    Minutes per session: Not on file  . Stress: Not on file  Relationships  . Social  connections:    Talks on phone: Not on file    Gets together: Not on file    Attends religious service: Not on file    Active member of club or organization: Not on file    Attends meetings of clubs or organizations: Not on file    Relationship status: Not on file  . Intimate partner violence:    Fear of current or ex partner: Not on file    Emotionally abused: Not on file    Physically abused: Not on file    Forced sexual activity: Not on file  Other Topics Concern  . Not on file  Social History Narrative  . Not on file    Mr. Rosenbach's family history is not on file.      Objective:    Vitals:   07/02/17 0951  BP: 140/80  Pulse: 66  SpO2: 98%    Physical Exam; well-developed older white male in no acute distress, very pleasant accompanied by his wife blood pressure 140/80 pulse 66, O2 sat 98%, height 5 foot 8, weight 204, BMI 31.0.  HEENT; nontraumatic normocephalic EOMI PERRLA sclera anicteric oropharynx clear, Cardiovascular; regular rate and rhythm with S1-S2 no murmur rub or gallop, Pulmonary ;clear bilaterally, Abdomen; soft, nontender nondistended bowel sounds are active there is no palpable mass or hepatosplenomegaly, Rectal; exam not done, Extremities ;no clubbing cyanosis or edema skin warm and dry, Neuro psych ;alert and oriented, grossly nonfocal mood and affect appropriate       Assessment & Plan:   #15 67 year old white male with history of GAVE syndrome who is status post EGD x2 in 2016 with APC of gastric AVMs.  He is referred back today for recurrent iron deficiency. I suspect this is secondary to GAVE with recurrent chronic occult blood loss  #2 chronic GERD-stable on Dexilant #3 history of colon polyps-type not clear prior colonoscopies with Dr. Lyda Jester #4 anxiety  Plan; #1 continue Dexilant 60 mg p.o. every morning #2   Continue Therazole 325 mg p.o. twice daily #3 check CBC today #4 Will need repeat iron studies in 3 to 4 months and if no  significant improvement in iron levels consider IV iron infusion #5 patient has  signed a release and we will obtain copies of his prior EGDs and colonoscopies with path, per Dr. Odie Sera to determine interval for follow-up colonoscopy #6 Will schedule for upper endoscopy with APC with Dr. Ardis Hughs at [facility].  Procedure was discussed in detail with the patient including indications, risks and benefits and he is agreeable to proceed.  Que Meneely S Pierson Vantol PA-C 07/02/2017   Cc: Charlynn Court, NP

## 2017-07-02 NOTE — Patient Instructions (Signed)
If you are age 67 or older, your body mass index should be between 23-30. Your Body mass index is 31.02 kg/m. If this is out of the aforementioned range listed, please consider follow up with your Primary Care Provider.  Continue Dexilant 60 mg- Take 1 tablet by mouth daily.   You will be scheduled for an endoscopy. Dr. Ardis Hughs nurse, Chong Sicilian will call you with the instructions and date. The possible date may be 07-19-2017. Wwe are having you sign the [place] form today.

## 2017-07-02 NOTE — Progress Notes (Signed)
 Subjective:    Patient ID: Charles Carr, male    DOB: 02/07/1951, 67 y.o.   MRN: [id number]  HPI Charles Carr is a pleasant 67-year-old white male, known to Dr. Jacobs from previous EGDs done in 2016.  Patient is referred back today by Charles Gunter, NP/[facility] for further evaluation of iron deficiency. Patient is also known to Dr. Misenheimer who had initially referred him in 2016 for EGD after finding of GAVE.  He underwent EGD with APC in January 2016 per Dr. Jacobs and this was repeated again in March 2016 with further ablation.  We have not seen him since. Patient says he had recent labs done and was found to be iron deficient.  Labs in April 2019 showed serum iron of 22, ferritin of 9C met unremarkable.  I do not see a CBC. His only symptom is fatigue, but he says this is not as bad as it was in 2016.  He does have chronic GERD as well which is controlled with Dexilant 60 mg p.o. daily.  He has no complaints of abdominal discomfort, no dysphagia or odynophagia.  No changes in bowel habits and no melena or hematochezia. He had not been maintained on chronic oral iron but was recently restarted on an iron supplement twice daily. He does not use any regular aspirin or NSAIDs.  Patient is also had prior colonoscopies by Dr. Misenheimer, he believes the last was about 3 years ago and he did have a polyp removed.  He is not certain when he was to have follow-up. Other medical problems include anxiety, history of kidney stones and prior appendectomy.  Review of Systems Pertinent positive and negative review of systems were noted in the above HPI section.  All other review of systems was otherwise negative.  Outpatient Encounter Medications as of 07/02/2017  Medication Sig  . acetaminophen (TYLENOL) 500 MG tablet Take by mouth every 6 (six) hours as needed for mild pain or headache.   . ALPRAZolam (XANAX) 0.25 MG tablet Take 0.25 mg by mouth daily as needed for anxiety.   . AMBULATORY NON  FORMULARY MEDICATION Medication Name: Mega Red Vitamin three per day  . dexlansoprazole (DEXILANT) 60 MG capsule Take 60 mg by mouth daily.  . ferrous sulfate (FEROSUL) 325 (65 FE) MG tablet Take 325 mg by mouth 2 (two) times daily with a meal.  . [DISCONTINUED] calcium carbonate (OS-CAL) 600 MG TABS tablet Take 600 mg by mouth 2 (two) times daily with a meal.  . [DISCONTINUED] Cholecalciferol 400 units CAPS Take 1 capsule by mouth daily.  . [DISCONTINUED] pantoprazole (PROTONIX) 40 MG tablet Take 40 mg by mouth daily.  . [DISCONTINUED] sertraline (ZOLOFT) 50 MG tablet Take 50 mg by mouth daily.  . [DISCONTINUED] spironolactone (ALDACTONE) 25 MG tablet Take 25 mg by mouth daily.  . [DISCONTINUED] valsartan (DIOVAN) 320 MG tablet Take 320 mg by mouth daily.   No facility-administered encounter medications on file as of 07/02/2017.    No Known Allergies Patient Active Problem List   Diagnosis Date Noted  . GAVE (gastric antral vascular ectasia) 07/02/2017  . Iron deficiency anemia 07/02/2017  . History of colonic polyps 07/02/2017  . Personal history of kidney stones 07/02/2017   Social History   Socioeconomic History  . Marital status: Married    Spouse name: Not on file  . Number of children: Not on file  . Years of education: Not on file  . Highest education level: Not on file  Occupational History  .   Not on file  Social Needs  . Financial resource strain: Not on file  . Food insecurity:    Worry: Not on file    Inability: Not on file  . Transportation needs:    Medical: Not on file    Non-medical: Not on file  Tobacco Use  . Smoking status: Former Smoker    Last attempt to quit: 02/13/1988    Years since quitting: 29.4  Substance and Sexual Activity  . Alcohol use: No  . Drug use: No  . Sexual activity: Not on file  Lifestyle  . Physical activity:    Days per week: Not on file    Minutes per session: Not on file  . Stress: Not on file  Relationships  . Social  connections:    Talks on phone: Not on file    Gets together: Not on file    Attends religious service: Not on file    Active member of club or organization: Not on file    Attends meetings of clubs or organizations: Not on file    Relationship status: Not on file  . Intimate partner violence:    Fear of current or ex partner: Not on file    Emotionally abused: Not on file    Physically abused: Not on file    Forced sexual activity: Not on file  Other Topics Concern  . Not on file  Social History Narrative  . Not on file    [name]'s family history is not on file.      Objective:    Vitals:   07/02/17 0951  BP: 140/80  Pulse: 66  SpO2: 98%    Physical Exam; well-developed older white male in no acute distress, very pleasant accompanied by his wife blood pressure 140/80 pulse 66, O2 sat 98%, height 5 foot 8, weight 204, BMI 31.0.  HEENT; nontraumatic normocephalic EOMI PERRLA sclera anicteric oropharynx clear, Cardiovascular; regular rate and rhythm with S1-S2 no murmur rub or gallop, Pulmonary ;clear bilaterally, Abdomen; soft, nontender nondistended bowel sounds are active there is no palpable mass or hepatosplenomegaly, Rectal; exam not done, Extremities ;no clubbing cyanosis or edema skin warm and dry, Neuro psych ;alert and oriented, grossly nonfocal mood and affect appropriate       Assessment & Plan:   #1 67-year-old white male with history of GAVE syndrome who is status post EGD x2 in 2016 with APC of gastric AVMs.  He is referred back today for recurrent iron deficiency. I suspect this is secondary to GAVE with recurrent chronic occult blood loss  #2 chronic GERD-stable on Dexilant #3 history of colon polyps-type not clear prior colonoscopies with Dr. Misenheimer #4 anxiety  Plan; #1 continue Dexilant 60 mg p.o. every morning #2   Continue Therazole 325 mg p.o. twice daily #3 check CBC today #4 Will need repeat iron studies in 3 to 4 months and if no  significant improvement in iron levels consider IV iron infusion #5 patient has  signed a release and we will obtain copies of his prior EGDs and colonoscopies with path, per Dr. Meisenheimer to determine interval for follow-up colonoscopy #6 Will schedule for upper endoscopy with APC with Dr. Jacobs [facility].  Procedure was discussed in detail with the patient including indications, risks and benefits and he is agreeable to proceed.  Dwayne Begay S Jaidyn Usery PA-C 07/02/2017   Cc: Carr, Charles G, NP  

## 2017-07-03 NOTE — Progress Notes (Signed)
I agree with the above note, plan 

## 2017-07-16 ENCOUNTER — Other Ambulatory Visit: Payer: Self-pay

## 2017-07-16 ENCOUNTER — Telehealth: Payer: Self-pay | Admitting: Gastroenterology

## 2017-07-16 DIAGNOSIS — D509 Iron deficiency anemia, unspecified: Secondary | ICD-10-CM

## 2017-07-16 DIAGNOSIS — K219 Gastro-esophageal reflux disease without esophagitis: Secondary | ICD-10-CM

## 2017-07-16 DIAGNOSIS — K31819 Angiodysplasia of stomach and duodenum without bleeding: Secondary | ICD-10-CM

## 2017-07-16 NOTE — Telephone Encounter (Signed)
The pt has been scheduled for 07/26/17 1030 am [facility].  He has been instructed and all questions answered.

## 2017-07-25 ENCOUNTER — Encounter (HOSPITAL_COMMUNITY): Payer: Self-pay | Admitting: *Deleted

## 2017-07-26 ENCOUNTER — Ambulatory Visit (HOSPITAL_COMMUNITY): Payer: Medicare HMO | Admitting: Certified Registered Nurse Anesthetist

## 2017-07-26 ENCOUNTER — Encounter (HOSPITAL_COMMUNITY)
Admission: RE | Disposition: A | Discharge status: Home / Self Care | Payer: Self-pay | Source: Ambulatory Visit | Attending: Gastroenterology

## 2017-07-26 ENCOUNTER — Ambulatory Visit (HOSPITAL_COMMUNITY)
Admission: RE | Admit: 2017-07-26 | Discharge: 2017-07-26 | Disposition: A | Discharge status: Home / Self Care | Payer: Medicare HMO | Source: Ambulatory Visit | Attending: Gastroenterology | Admitting: Gastroenterology

## 2017-07-26 ENCOUNTER — Other Ambulatory Visit: Payer: Self-pay

## 2017-07-26 ENCOUNTER — Encounter (HOSPITAL_COMMUNITY): Payer: Self-pay

## 2017-07-26 DIAGNOSIS — D509 Iron deficiency anemia, unspecified: Secondary | ICD-10-CM | POA: Diagnosis not present

## 2017-07-26 DIAGNOSIS — Z79899 Other long term (current) drug therapy: Secondary | ICD-10-CM | POA: Diagnosis not present

## 2017-07-26 DIAGNOSIS — K219 Gastro-esophageal reflux disease without esophagitis: Secondary | ICD-10-CM | POA: Insufficient documentation

## 2017-07-26 DIAGNOSIS — M25562 Pain in left knee: Secondary | ICD-10-CM | POA: Diagnosis not present

## 2017-07-26 DIAGNOSIS — F419 Anxiety disorder, unspecified: Secondary | ICD-10-CM | POA: Diagnosis not present

## 2017-07-26 DIAGNOSIS — K31819 Angiodysplasia of stomach and duodenum without bleeding: Secondary | ICD-10-CM | POA: Diagnosis not present

## 2017-07-26 DIAGNOSIS — Z87442 Personal history of urinary calculi: Secondary | ICD-10-CM | POA: Diagnosis not present

## 2017-07-26 DIAGNOSIS — Z86010 Personal history of colonic polyps: Secondary | ICD-10-CM | POA: Insufficient documentation

## 2017-07-26 DIAGNOSIS — Z87891 Personal history of nicotine dependence: Secondary | ICD-10-CM | POA: Diagnosis not present

## 2017-07-26 DIAGNOSIS — R69 Illness, unspecified: Secondary | ICD-10-CM | POA: Diagnosis not present

## 2017-07-26 HISTORY — PX: ESOPHAGOGASTRODUODENOSCOPY (EGD) WITH PROPOFOL: SHX5813

## 2017-07-26 HISTORY — PX: HOT HEMOSTASIS: SHX5433

## 2017-07-26 LAB — CBC
HCT: 40.9 % (ref 39.0–52.0)
Hemoglobin: 12.4 g/dL — ABNORMAL LOW (ref 13.0–17.0)
MCH: 24 pg — ABNORMAL LOW (ref 26.0–34.0)
MCHC: 30.3 g/dL (ref 30.0–36.0)
MCV: 79.1 fL (ref 78.0–100.0)
Platelets: 272 10*3/uL (ref 150–400)
RBC: 5.17 MIL/uL (ref 4.22–5.81)
RDW: 18.8 % — ABNORMAL HIGH (ref 11.5–15.5)
WBC: 7 10*3/uL (ref 4.0–10.5)

## 2017-07-26 SURGERY — ESOPHAGOGASTRODUODENOSCOPY (EGD) WITH PROPOFOL
Anesthesia: Monitor Anesthesia Care

## 2017-07-26 MED ORDER — PROPOFOL 10 MG/ML IV BOLUS
INTRAVENOUS | Status: DC | PRN
  Administered 2017-07-26 (×3): 50 mg via INTRAVENOUS

## 2017-07-26 MED ORDER — LIDOCAINE 2% (20 MG/ML) 5 ML SYRINGE
INTRAMUSCULAR | Status: DC | PRN
  Administered 2017-07-26: 100 mg via INTRAVENOUS

## 2017-07-26 MED ORDER — LACTATED RINGERS IV SOLN
INTRAVENOUS | Status: DC
  Administered 2017-07-26: 1000 mL via INTRAVENOUS

## 2017-07-26 MED ORDER — SODIUM CHLORIDE 0.9 % IV SOLN: INTRAVENOUS | Status: DC

## 2017-07-26 MED ORDER — PROPOFOL 10 MG/ML IV BOLUS
INTRAVENOUS | Status: AC
  Filled 2017-07-26: qty 20

## 2017-07-26 SURGICAL SUPPLY — 14 items

## 2017-07-26 NOTE — Discharge Instructions (Signed)

## 2017-07-26 NOTE — Op Note (Signed)
[facility] Patient Name: Charles Carr Procedure Date: 07/26/2017 MRN: [id number] Attending MD: Milus Banister , MD Date of Birth: May 11, 1950 CSN: [id number] Age: 67 Admit Type: Outpatient Procedure:                Upper GI endoscopy Indications:              Iron deficiency anemia (very mild, Hb this morning                            12.3), h/o GAVE treated with APC twice by Dr.                            Ardis Hughs, last was 2016 Providers:                Milus Banister, MD, Cleda Daub, RN, William Dalton, Technician Referring MD:              Medicines:                Monitored Anesthesia Care Complications:            No immediate complications. Estimated blood loss:                            None. Estimated Blood Loss:     Estimated blood loss: none. Procedure:                Pre-Anesthesia Assessment:                           - Prior to the procedure, a History and Physical                            was performed, and patient medications and                            allergies were reviewed. [name] tolerance of                            previous anesthesia was also reviewed. The risks                            and benefits of the procedure and the sedation                            options and risks were discussed with the patient.                            All questions were answered, and informed consent                            was obtained. Prior Anticoagulants: The patient has  taken no previous anticoagulant or antiplatelet                            agents. ASA Grade Assessment: II - A patient with                            mild systemic disease. After reviewing the risks                            and benefits, the patient was deemed in                            satisfactory condition to undergo the procedure.                           After obtaining informed consent, the  endoscope was                            passed under direct vision. Throughout the                            procedure, the patient's blood pressure, pulse, and                            oxygen saturations were monitored continuously. The                            EG-[id number] ([id number]) scope was introduced through the                            mouth, and advanced to the second part of duodenum.                            The upper GI endoscopy was accomplished without                            difficulty. The patient tolerated the procedure                            well. Scope In: Scope Out: Findings:      Mild gastric antral vascular ectasia without bleeding was present in the       gastric antrum (much improved from 2016 overall). Coagulation for tissue       destruction using argon plasma was successful.      The exam was otherwise without abnormality. Impression:               - Typical appearing GAVE (overall much improved                            since 2016 examinations). Treated with application                            of argon plasma coagulation (APC) today.                           -  The examination was otherwise normal. Moderate Sedation:      N/A- Per Anesthesia Care Recommendation:           - Patient has a contact number available for                            emergencies. The signs and symptoms of potential                            delayed complications were discussed with the                            patient. Return to normal activities tomorrow.                            Written discharge instructions were provided to the                            patient.                           - Resume previous diet.                           - Continue present medications. Once daily iron                            supplement.                           - Return office visit with Dr. Ardis Hughs in 3 months                            (cbc, iron studies a few days  prior). Dr. Ardis Hughs'                            office will call you to schedule. Procedure Code(s):        --- Professional ---                           [phone], Esophagogastroduodenoscopy, flexible,                            transoral; with ablation of tumor(s), polyp(s), or                            other lesion(s) (includes pre- and post-dilation                            and guide wire passage, when performed) Diagnosis Code(s):        --- Professional ---                           K31.819, Angiodysplasia of stomach and duodenum  without bleeding                           D50.9, Iron deficiency anemia, unspecified CPT copyright 2017 American Medical Association. All rights reserved. The codes documented in this report are preliminary and upon coder review may  be revised to meet current compliance requirements. Milus Banister, MD 07/26/2017 10:14:46 AM This report has been signed electronically. Number of Addenda: 0

## 2017-07-26 NOTE — Anesthesia Procedure Notes (Signed)
Procedure Name: MAC Date/Time: 07/26/2017 9:49 AM Performed by: Deliah Boston, CRNA Pre-anesthesia Checklist: Patient identified, Emergency Drugs available, Suction available, Patient being monitored and Timeout performed Patient Re-evaluated:Patient Re-evaluated prior to induction Oxygen Delivery Method: Nasal cannula Placement Confirmation: positive ETCO2,  CO2 detector and breath sounds checked- equal and bilateral

## 2017-07-26 NOTE — Transfer of Care (Signed)
Immediate Anesthesia Transfer of Care Note  Patient: Charles Carr  Procedure(s) Performed: Procedure(s): ESOPHAGOGASTRODUODENOSCOPY (EGD) WITH PROPOFOL (N/A) HOT HEMOSTASIS (ARGON PLASMA COAGULATION/BICAP) (N/A)  Patient Location: PACU  Anesthesia Type:MAC  Level of Consciousness: Patient easily awoken, sedated, comfortable, cooperative, following commands, responds to stimulation.   Airway & Oxygen Therapy: Patient spontaneously breathing, ventilating well, oxygen via simple oxygen mask.  Post-op Assessment: Report given to PACU RN, vital signs reviewed and stable, moving all extremities.   Post vital signs: Reviewed and stable.  Complications: No apparent anesthesia complications Last Vitals:  Vitals Value Taken Time  BP    Temp    Pulse 60 07/26/2017 10:08 AM  Resp 10 07/26/2017 10:08 AM  SpO2 100 % 07/26/2017 10:08 AM  Vitals shown include unvalidated device data.  Last Pain:  Vitals:   07/26/17 0903  TempSrc: Oral  PainSc: 0-No pain         Complications: No apparent anesthesia complications

## 2017-07-26 NOTE — Anesthesia Preprocedure Evaluation (Signed)
Anesthesia Evaluation  Patient identified by MRN, date of birth, ID band Patient awake    Reviewed: Allergy & Precautions, NPO status , Patient's Chart, lab work & pertinent test results  Airway Mallampati: II  TM Distance: >3 FB Neck ROM: Full    Dental   Pulmonary former smoker,    breath sounds clear to auscultation       Cardiovascular negative cardio ROS   Rhythm:Regular Rate:Normal     Neuro/Psych negative neurological ROS     GI/Hepatic Neg liver ROS, GERD  ,  Endo/Other  negative endocrine ROS  Renal/GU negative Renal ROS     Musculoskeletal   Abdominal   Peds  Hematology  (+) anemia ,   Anesthesia Other Findings   Reproductive/Obstetrics                             Anesthesia Physical Anesthesia Plan  ASA: II  Anesthesia Plan: MAC   Post-op Pain Management:    Induction: Intravenous  PONV Risk Score and Plan: 1 and Propofol infusion and Treatment may vary due to age or medical condition  Airway Management Planned: Natural Airway and Nasal Cannula  Additional Equipment:   Intra-op Plan:   Post-operative Plan:   Informed Consent: I have reviewed the patients History and Physical, chart, labs and discussed the procedure including the risks, benefits and alternatives for the proposed anesthesia with the patient or authorized representative who has indicated his/her understanding and acceptance.     Plan Discussed with: CRNA  Anesthesia Plan Comments:         Anesthesia Quick Evaluation

## 2017-07-26 NOTE — Interval H&P Note (Signed)
History and Physical Interval Note:  07/26/2017 9:40 AM  Charles Carr  has presented today for surgery, with the diagnosis of GAVE, GERD  The various methods of treatment have been discussed with the patient and family. After consideration of risks, benefits and other options for treatment, the patient has consented to  Procedure(s): ESOPHAGOGASTRODUODENOSCOPY (EGD) WITH PROPOFOL (N/A) as a surgical intervention .  [name] history has been reviewed, patient examined, no change in status, stable for surgery.  I have reviewed the patient's chart and labs.  Questions were answered to the patient's satisfaction.     Milus Banister

## 2017-07-26 NOTE — Anesthesia Postprocedure Evaluation (Signed)
Anesthesia Post Note  Patient: Charles Carr  Procedure(s) Performed: ESOPHAGOGASTRODUODENOSCOPY (EGD) WITH PROPOFOL (N/A ) HOT HEMOSTASIS (ARGON PLASMA COAGULATION/BICAP) (N/A )     Patient location during evaluation: PACU Anesthesia Type: MAC Level of consciousness: awake and alert Pain management: pain level controlled Vital Signs Assessment: post-procedure vital signs reviewed and stable Respiratory status: spontaneous breathing, nonlabored ventilation, respiratory function stable and patient connected to nasal cannula oxygen Cardiovascular status: stable and blood pressure returned to baseline Postop Assessment: no apparent nausea or vomiting Anesthetic complications: no    Last Vitals:  Vitals:   07/26/17 1020 07/26/17 1030  BP:  (!) 150/77  Pulse: 60 (!) 58  Resp: 16 17  Temp:    SpO2: 99% 98%    Last Pain:  Vitals:   07/26/17 1030  TempSrc:   PainSc: 0-No pain                 Tiajuana Amass

## 2017-07-27 ENCOUNTER — Encounter (HOSPITAL_COMMUNITY): Payer: Self-pay | Admitting: Gastroenterology

## 2017-08-20 ENCOUNTER — Telehealth: Payer: Self-pay | Admitting: *Deleted

## 2017-08-20 ENCOUNTER — Telehealth: Payer: Self-pay

## 2017-08-20 NOTE — Telephone Encounter (Signed)
-----   Message from Timothy Lasso, RN sent at 07/26/2017 11:04 AM EDT -----   ----- Message ----- From: Milus Banister, MD Sent: 07/26/2017  10:17 AM To: Timothy Lasso, RN  He needs rov with me in 3 months, CBC/iron/ferritin a few days prior  thanks

## 2017-08-20 NOTE — Telephone Encounter (Signed)
The schedule is not out far enough to set up appt.  Staff message to call pt and set up in a few weeks

## 2017-08-20 NOTE — Telephone Encounter (Signed)
Received records from [facility]. EGD, Progress note as well, Dr. Danton Clap. Stamped and sent to be scanned, 08-20-2017.

## 2017-09-27 DIAGNOSIS — M1712 Unilateral primary osteoarthritis, left knee: Secondary | ICD-10-CM | POA: Diagnosis not present

## 2017-09-27 DIAGNOSIS — S83242A Other tear of medial meniscus, current injury, left knee, initial encounter: Secondary | ICD-10-CM | POA: Diagnosis not present

## 2017-10-22 ENCOUNTER — Telehealth: Payer: Self-pay

## 2017-10-22 ENCOUNTER — Other Ambulatory Visit: Payer: Self-pay

## 2017-10-22 DIAGNOSIS — K31819 Angiodysplasia of stomach and duodenum without bleeding: Secondary | ICD-10-CM

## 2017-10-22 DIAGNOSIS — D509 Iron deficiency anemia, unspecified: Secondary | ICD-10-CM

## 2017-10-22 NOTE — Telephone Encounter (Signed)
-----   Message from Timothy Lasso, RN sent at 08/20/2017 11:07 AM EDT ----- rov with me in 3 months, CBC/iron/ferritin a few days prior

## 2017-10-22 NOTE — Telephone Encounter (Signed)
ROV with Dr Ardis Hughs on 12/03/17 1030 am and labs a day prior. The pt has been advised of the information and verbalized understanding.

## 2017-11-29 ENCOUNTER — Other Ambulatory Visit (INDEPENDENT_AMBULATORY_CARE_PROVIDER_SITE_OTHER): Payer: Medicare HMO

## 2017-11-29 DIAGNOSIS — D509 Iron deficiency anemia, unspecified: Secondary | ICD-10-CM

## 2017-11-29 DIAGNOSIS — K31819 Angiodysplasia of stomach and duodenum without bleeding: Secondary | ICD-10-CM

## 2017-11-29 LAB — CBC WITH DIFFERENTIAL/PLATELET
BASOS ABS: 0.1 10*3/uL (ref 0.0–0.1)
Basophils Relative: 0.7 % (ref 0.0–3.0)
EOS ABS: 0.2 10*3/uL (ref 0.0–0.7)
Eosinophils Relative: 3.1 % (ref 0.0–5.0)
HEMATOCRIT: 40.7 % (ref 39.0–52.0)
HEMOGLOBIN: 13.2 g/dL (ref 13.0–17.0)
LYMPHS PCT: 22.9 % (ref 12.0–46.0)
Lymphs Abs: 1.6 10*3/uL (ref 0.7–4.0)
MCHC: 32.3 g/dL (ref 30.0–36.0)
MCV: 78.2 fl (ref 78.0–100.0)
MONOS PCT: 10 % (ref 3.0–12.0)
Monocytes Absolute: 0.7 10*3/uL (ref 0.1–1.0)
Neutro Abs: 4.4 10*3/uL (ref 1.4–7.7)
Neutrophils Relative %: 63.3 % (ref 43.0–77.0)
Platelets: 256 10*3/uL (ref 150.0–400.0)
RBC: 5.21 Mil/uL (ref 4.22–5.81)
RDW: 17.5 % — ABNORMAL HIGH (ref 11.5–15.5)
WBC: 6.9 10*3/uL (ref 4.0–10.5)

## 2017-11-29 LAB — FERRITIN: Ferritin: 7.5 ng/mL — ABNORMAL LOW (ref 22.0–322.0)

## 2017-11-29 LAB — IBC PANEL
Iron: 20 ug/dL — ABNORMAL LOW (ref 42–165)
SATURATION RATIOS: 3.7 % — AB (ref 20.0–50.0)
TRANSFERRIN: 381 mg/dL — AB (ref 212.0–360.0)

## 2017-12-03 ENCOUNTER — Ambulatory Visit: Payer: Medicare HMO | Admitting: Gastroenterology

## 2017-12-03 ENCOUNTER — Encounter: Payer: Self-pay | Admitting: Gastroenterology

## 2017-12-03 VITALS — BP 128/76 | HR 62 | Ht 68.0 in | Wt 200.0 lb

## 2017-12-03 DIAGNOSIS — D509 Iron deficiency anemia, unspecified: Secondary | ICD-10-CM

## 2017-12-03 NOTE — Patient Instructions (Addendum)
We will get records sent from your previous gastroenterologist for review.  This will include any endoscopic (colonoscopies) procedures and any associated pathology reports.   Labs in 6 months (cbc, iron, ferritin) Take one OTC iron pill once daily.  Your provider has requested that you go to the basement level for lab work before leaving today. Press "B" on the elevator. The lab is located at the first door on the left as you exit the elevator.  Thank you for entrusting me with your care and choosing [facility].  Dr Ardis Hughs

## 2017-12-03 NOTE — Progress Notes (Signed)
Review of pertinent gastrointestinal problems: 1. Iron deficiency anemia (mild) from chronic GAVE: Originally referred by Dr. Sandria Senter, patient had been requiring iron infusions.  EGD with APC treatment Dr. Ardis Hughs 02/2014, 04/2014, 02/2016 ([place] BICAP), 07/2017  HPI: This is a very pleasant 67 year old man whom I last saw the time of upper endoscopy with APC cautery about 3 and half months ago.  He had labs drawn last week and they show he is not anemic but his iron levels are still a bit low.  He admits today that he actually stopped taking his over-the-counter iron supplements 2 to 3 months ago.  He is not sure why, he does stop taking it.  It was never really causing any issues with his bowels.  Chief complaint is iron deficiency, GAVE  ROS: complete GI ROS as described in HPI, all other review negative.  Constitutional:  No unintentional weight loss   Past Medical History:  Diagnosis Date  . Anemia    iron infusion 1'15   . GERD (gastroesophageal reflux disease)   . History of kidney stones     Past Surgical History:  Procedure Laterality Date  . APPENDECTOMY    . ESOPHAGOGASTRODUODENOSCOPY (EGD) WITH PROPOFOL N/A 02/26/2014   Procedure: ESOPHAGOGASTRODUODENOSCOPY (EGD) WITH PROPOFOL;  Surgeon: Milus Banister, MD;  Location: [facility];  Service: Endoscopy;  Laterality: N/A;  APC  . ESOPHAGOGASTRODUODENOSCOPY (EGD) WITH PROPOFOL N/A 04/16/2014   Procedure: ESOPHAGOGASTRODUODENOSCOPY (EGD) WITH PROPOFOL;  Surgeon: Milus Banister, MD;  Location: [facility];  Service: Endoscopy;  Laterality: N/A;  . ESOPHAGOGASTRODUODENOSCOPY (EGD) WITH PROPOFOL N/A 07/26/2017   Procedure: ESOPHAGOGASTRODUODENOSCOPY (EGD) WITH PROPOFOL;  Surgeon: Milus Banister, MD;  Location: [facility];  Service: Endoscopy;  Laterality: N/A;  . ESOPHAGOGASTRODUODENOSCOPY ENDOSCOPY     [facility]  . HOT HEMOSTASIS N/A 02/26/2014   Procedure: HOT HEMOSTASIS (ARGON PLASMA COAGULATION/BICAP);   Surgeon: Milus Banister, MD;  Location: [facility];  Service: Endoscopy;  Laterality: N/A;  . HOT HEMOSTASIS N/A 07/26/2017   Procedure: HOT HEMOSTASIS (ARGON PLASMA COAGULATION/BICAP);  Surgeon: Milus Banister, MD;  Location: [facility];  Service: Endoscopy;  Laterality: N/A;  . KIDNEY STONE SURGERY     x2  . TONSILLECTOMY      Current Outpatient Medications  Medication Sig Dispense Refill  . ALPRAZolam (XANAX) 0.25 MG tablet Take 0.25 mg by mouth daily as needed for anxiety.   1  . dexlansoprazole (DEXILANT) 60 MG capsule Take 60 mg by mouth daily.     No current facility-administered medications for this visit.     Allergies as of 12/03/2017  . (No Known Allergies)    History reviewed. No pertinent family history.  Social History   Socioeconomic History  . Marital status: Married    Spouse name: Not on file  . Number of children: Not on file  . Years of education: Not on file  . Highest education level: Not on file  Occupational History  . Not on file  Social Needs  . Financial resource strain: Not on file  . Food insecurity:    Worry: Not on file    Inability: Not on file  . Transportation needs:    Medical: Not on file    Non-medical: Not on file  Tobacco Use  . Smoking status: Former Smoker    Last attempt to quit: 02/13/1988    Years since quitting: 29.8  . Smokeless tobacco: Never Used  Substance and Sexual Activity  . Alcohol use: No  .  Drug use: No  . Sexual activity: Not on file  Lifestyle  . Physical activity:    Days per week: Not on file    Minutes per session: Not on file  . Stress: Not on file  Relationships  . Social connections:    Talks on phone: Not on file    Gets together: Not on file    Attends religious service: Not on file    Active member of club or organization: Not on file    Attends meetings of clubs or organizations: Not on file    Relationship status: Not on file  . Intimate partner violence:    Fear of current or ex  partner: Not on file    Emotionally abused: Not on file    Physically abused: Not on file    Forced sexual activity: Not on file  Other Topics Concern  . Not on file  Social History Narrative  . Not on file     Physical Exam: BP 128/76   Pulse 62   Ht 5\' 8"  (1.727 m)   Wt 200 lb (90.7 kg)   BMI 30.41 kg/m  Constitutional: generally well-appearing Psychiatric: alert and oriented x3 Abdomen: soft, nontender, nondistended, no obvious ascites, no peritoneal signs, normal bowel sounds No peripheral edema noted in lower extremities  Assessment and plan: 67 y.o. male with GAVE causing iron deficiency, previous anemia  He is not anemic but he does still have iron deficiency.  I suspect this is from his GAVE.  He knows he should resume his once daily over-the-counter iron supplement.  He will have CBC in 6 months from now with iron levels checked at that point.  He previously had colonoscopy with an outside provider.  He wants to switch all of his care here and we will get records from his previous colonoscopies, biopsy reports as well sent here for my review so that I can determine the timing of his next indicated examination.  Please see the "Patient Instructions" section for addition details about the plan.  Shutters Loffler, MD [facility] 12/03/2017, 10:49 AM

## 2017-12-11 ENCOUNTER — Telehealth: Payer: Self-pay | Admitting: Gastroenterology

## 2017-12-11 NOTE — Telephone Encounter (Signed)
   Colonoscopy December 2015, Dr. Lyda Jester at [facility].  Indication "history of colon polyps, iron deficiency anemia".  He found a single 3 mm polyp in the ascending colon that was removed with standard biopsy forceps.  There will also internal hemorrhoids.  Pathology proved that this was a tubular adenoma.  The doctor recommended that he have follow-up colonoscopy at 5-year interval.  I agree with that recommendation.   Can you please contact him, let him know that I reviewed his 2015 colonoscopy and pathology report.  He needs recall colonoscopy December 2020.

## 2017-12-11 NOTE — Telephone Encounter (Signed)
Spoke to patient to inform him that Dr Ardis Hughs has reviewed his 2015  records from Dr Lyda Jester at [facility]. He voiced understanding regarding the need for a colonoscopy in December 2020. A Recall colon has been placed.

## 2018-05-28 ENCOUNTER — Telehealth: Payer: Self-pay

## 2018-05-28 ENCOUNTER — Other Ambulatory Visit: Payer: Self-pay | Admitting: Gastroenterology

## 2018-05-28 DIAGNOSIS — D509 Iron deficiency anemia, unspecified: Secondary | ICD-10-CM

## 2018-05-28 NOTE — Telephone Encounter (Signed)
Spoke to patient. He will come in this week and have labs drawn per Dr Ardis Hughs office note from 10/19. Patient voiced understanding.

## 2018-06-06 ENCOUNTER — Other Ambulatory Visit (INDEPENDENT_AMBULATORY_CARE_PROVIDER_SITE_OTHER): Payer: Medicare HMO

## 2018-06-06 DIAGNOSIS — D509 Iron deficiency anemia, unspecified: Secondary | ICD-10-CM | POA: Diagnosis not present

## 2018-06-06 LAB — CBC WITH DIFFERENTIAL/PLATELET
Basophils Absolute: 0.1 10*3/uL (ref 0.0–0.1)
Basophils Relative: 0.9 % (ref 0.0–3.0)
Eosinophils Absolute: 0.2 10*3/uL (ref 0.0–0.7)
Eosinophils Relative: 3.2 % (ref 0.0–5.0)
HCT: 41.8 % (ref 39.0–52.0)
Hemoglobin: 13.6 g/dL (ref 13.0–17.0)
Lymphocytes Relative: 27 % (ref 12.0–46.0)
Lymphs Abs: 1.7 10*3/uL (ref 0.7–4.0)
MCHC: 32.4 g/dL (ref 30.0–36.0)
MCV: 80.3 fl (ref 78.0–100.0)
Monocytes Absolute: 0.6 10*3/uL (ref 0.1–1.0)
Monocytes Relative: 9.1 % (ref 3.0–12.0)
Neutro Abs: 3.8 10*3/uL (ref 1.4–7.7)
Neutrophils Relative %: 59.8 % (ref 43.0–77.0)
Platelets: 251 10*3/uL (ref 150.0–400.0)
RBC: 5.21 Mil/uL (ref 4.22–5.81)
RDW: 16.2 % — ABNORMAL HIGH (ref 11.5–15.5)
WBC: 6.4 10*3/uL (ref 4.0–10.5)

## 2018-06-06 LAB — IRON: Iron: 84 ug/dL (ref 42–165)

## 2018-06-06 LAB — FERRITIN: Ferritin: 9 ng/mL — ABNORMAL LOW (ref 22.0–322.0)

## 2018-12-16 ENCOUNTER — Other Ambulatory Visit: Payer: Self-pay | Admitting: Gastroenterology

## 2018-12-16 DIAGNOSIS — D509 Iron deficiency anemia, unspecified: Secondary | ICD-10-CM

## 2018-12-18 ENCOUNTER — Other Ambulatory Visit (INDEPENDENT_AMBULATORY_CARE_PROVIDER_SITE_OTHER): Payer: Medicare HMO

## 2018-12-18 DIAGNOSIS — D509 Iron deficiency anemia, unspecified: Secondary | ICD-10-CM

## 2018-12-18 LAB — CBC WITH DIFFERENTIAL/PLATELET
Basophils Absolute: 0.1 10*3/uL (ref 0.0–0.1)
Basophils Relative: 0.7 % (ref 0.0–3.0)
Eosinophils Absolute: 0.4 10*3/uL (ref 0.0–0.7)
Eosinophils Relative: 4.4 % (ref 0.0–5.0)
HCT: 38.4 % — ABNORMAL LOW (ref 39.0–52.0)
Hemoglobin: 12.4 g/dL — ABNORMAL LOW (ref 13.0–17.0)
Lymphocytes Relative: 22.1 % (ref 12.0–46.0)
Lymphs Abs: 1.8 10*3/uL (ref 0.7–4.0)
MCHC: 32.2 g/dL (ref 30.0–36.0)
MCV: 79.7 fl (ref 78.0–100.0)
Monocytes Absolute: 0.8 10*3/uL (ref 0.1–1.0)
Monocytes Relative: 9.8 % (ref 3.0–12.0)
Neutro Abs: 5 10*3/uL (ref 1.4–7.7)
Neutrophils Relative %: 63 % (ref 43.0–77.0)
Platelets: 261 10*3/uL (ref 150.0–400.0)
RBC: 4.82 Mil/uL (ref 4.22–5.81)
RDW: 16 % — ABNORMAL HIGH (ref 11.5–15.5)
WBC: 7.9 10*3/uL (ref 4.0–10.5)

## 2018-12-18 LAB — IBC + FERRITIN
Ferritin: 7.4 ng/mL — ABNORMAL LOW (ref 22.0–322.0)
Iron: 40 ug/dL — ABNORMAL LOW (ref 42–165)
Saturation Ratios: 8.1 % — ABNORMAL LOW (ref 20.0–50.0)
Transferrin: 351 mg/dL (ref 212.0–360.0)

## 2018-12-23 ENCOUNTER — Other Ambulatory Visit: Payer: Self-pay

## 2018-12-23 DIAGNOSIS — D509 Iron deficiency anemia, unspecified: Secondary | ICD-10-CM

## 2019-01-08 ENCOUNTER — Encounter: Payer: Self-pay | Admitting: Gastroenterology

## 2019-01-22 ENCOUNTER — Encounter: Payer: Self-pay | Admitting: Gastroenterology

## 2019-02-10 ENCOUNTER — Other Ambulatory Visit: Payer: Self-pay

## 2019-02-10 ENCOUNTER — Ambulatory Visit: Payer: Medicare HMO | Admitting: *Deleted

## 2019-02-10 VITALS — Temp 95.2°F | Ht 68.0 in | Wt 195.2 lb

## 2019-02-10 DIAGNOSIS — Z1159 Encounter for screening for other viral diseases: Secondary | ICD-10-CM

## 2019-02-10 DIAGNOSIS — Z86010 Personal history of colonic polyps: Secondary | ICD-10-CM

## 2019-02-10 NOTE — Addendum Note (Signed)
Addended by: Laverna Peace on: 02/10/2019 03:23 PM   Modules accepted: Orders

## 2019-02-10 NOTE — Progress Notes (Signed)
Pt is aware that care partner will wait in the car during procedure; if they feel like they will be too hot or cold to wait in the car; they may wait in the 4 th floor lobby. Patient is aware to bring only one care partner. We want them to wear a mask (we do not have any that we can provide them), practice social distancing, and we will check their temperatures when they get here.  I did remind the patient that their care partner needs to stay in the parking lot the entire time and have a cell phone available, we will call them when the pt is ready for discharge. Patient will wear mask into building.  No egg or soy allergy  No home oxygen use or problems with anesthesia  No medications for weight loss taken  emmi information given covid test 02-19-19 at 920

## 2019-02-17 ENCOUNTER — Encounter: Payer: Self-pay | Admitting: Gastroenterology

## 2019-02-19 ENCOUNTER — Ambulatory Visit (INDEPENDENT_AMBULATORY_CARE_PROVIDER_SITE_OTHER): Payer: Medicare HMO

## 2019-02-19 ENCOUNTER — Other Ambulatory Visit: Payer: Self-pay | Admitting: Gastroenterology

## 2019-02-19 DIAGNOSIS — Z1159 Encounter for screening for other viral diseases: Secondary | ICD-10-CM

## 2019-02-19 LAB — SARS CORONAVIRUS 2 (TAT 6-24 HRS): SARS Coronavirus 2: NEGATIVE

## 2019-02-24 ENCOUNTER — Encounter: Payer: Self-pay | Admitting: Gastroenterology

## 2019-02-24 ENCOUNTER — Ambulatory Visit (AMBULATORY_SURGERY_CENTER): Payer: Medicare HMO | Admitting: Gastroenterology

## 2019-02-24 ENCOUNTER — Other Ambulatory Visit: Payer: Self-pay

## 2019-02-24 VITALS — BP 126/79 | HR 53 | Temp 98.4°F | Resp 13 | Ht 68.0 in | Wt 195.2 lb

## 2019-02-24 DIAGNOSIS — D123 Benign neoplasm of transverse colon: Secondary | ICD-10-CM | POA: Diagnosis not present

## 2019-02-24 DIAGNOSIS — D125 Benign neoplasm of sigmoid colon: Secondary | ICD-10-CM

## 2019-02-24 DIAGNOSIS — Z86010 Personal history of colonic polyps: Secondary | ICD-10-CM | POA: Diagnosis present

## 2019-02-24 DIAGNOSIS — D124 Benign neoplasm of descending colon: Secondary | ICD-10-CM

## 2019-02-24 MED ORDER — SODIUM CHLORIDE 0.9 % IV SOLN: 500.0000 mL | INTRAVENOUS | Status: DC

## 2019-02-24 NOTE — Progress Notes (Signed)
Called to room to assist during endoscopic procedure.  Patient ID and intended procedure confirmed with present staff. Received instructions for my participation in the procedure from the performing physician.  

## 2019-02-24 NOTE — Op Note (Signed)
[facility] Patient Name: Charles Carr Procedure Date: 02/24/2019 10:29 AM MRN: [id number] Endoscopist: Milus Banister , MD Age: 69 Referring MD:  Date of Birth: 06-Mar-1950 Gender: Male Account #: [id number] Procedure:                Colonoscopy Indications:              High risk colon cancer surveillance: Personal                            history of colonic polyps; Colonoscopy 2015 Dr.                            Lyda Jester found 1 subCM adenoma Medicines:                Monitored Anesthesia Care Procedure:                Pre-Anesthesia Assessment:                           - Prior to the procedure, a History and Physical                            was performed, and patient medications and                            allergies were reviewed. [name] tolerance of                            previous anesthesia was also reviewed. The risks                            and benefits of the procedure and the sedation                            options and risks were discussed with the patient.                            All questions were answered, and informed consent                            was obtained. Prior Anticoagulants: The patient has                            taken no previous anticoagulant or antiplatelet                            agents. ASA Grade Assessment: II - A patient with                            mild systemic disease. After reviewing the risks                            and benefits, the patient was deemed in  satisfactory condition to undergo the procedure.                           After obtaining informed consent, the colonoscope                            was passed under direct vision. Throughout the                            procedure, the patient's blood pressure, pulse, and                            oxygen saturations were monitored continuously. The                            Colonoscope was introduced through  the anus and                            advanced to the the cecum, identified by                            appendiceal orifice and ileocecal valve. The                            colonoscopy was performed without difficulty. The                            patient tolerated the procedure well. The quality                            of the bowel preparation was excellent. Scope In: 10:37:29 AM Scope Out: 10:52:13 AM Scope Withdrawal Time: 0 hours 11 minutes 46 seconds  Total Procedure Duration: 0 hours 14 minutes 44 seconds  Findings:                 Four sessile polyps were found in the sigmoid                            colon, descending colon and transverse colon. The                            polyps were 2 to 5 mm in size. These polyps were                            removed with a cold snare. Resection and retrieval                            were complete.                           Internal hemorrhoids were found. The hemorrhoids                            were small.  The exam was otherwise without abnormality on                            direct and retroflexion views. Complications:            No immediate complications. Estimated blood loss:                            None. Estimated Blood Loss:     Estimated blood loss: none. Impression:               - Four 2 to 5 mm polyps in the sigmoid colon, in                            the descending colon and in the transverse colon,                            removed with a cold snare. Resected and retrieved.                           - Internal hemorrhoids.                           - The examination was otherwise normal on direct                            and retroflexion views. Recommendation:           - Patient has a contact number available for                            emergencies. The signs and symptoms of potential                            delayed complications were discussed with the                             patient. Return to normal activities tomorrow.                            Written discharge instructions were provided to the                            patient.                           - Resume previous diet.                           - Continue present medications.                           - Await pathology results. Milus Banister, MD 02/24/2019 10:55:01 AM This report has been signed electronically.

## 2019-02-24 NOTE — Progress Notes (Signed)
Report given to PACU, vss 

## 2019-02-24 NOTE — Patient Instructions (Signed)
Please, read all of the handouts given to you by your recovery room nurse.  Thank-you for choosing [facility] for your healthcare needs today.  YOU HAD AN ENDOSCOPIC PROCEDURE TODAY AT [facility]:   Refer to the procedure report that was given to you for any specific questions about what was found during the examination.  If the procedure report does not answer your questions, please call your gastroenterologist to clarify.  If you requested that your care partner not be given the details of your procedure findings, then the procedure report has been included in a sealed envelope for you to review at your convenience later.  YOU SHOULD EXPECT: Some feelings of bloating in the abdomen. Passage of more gas than usual.  Walking can help get rid of the air that was put into your GI tract during the procedure and reduce the bloating. If you had a lower endoscopy (such as a colonoscopy or flexible sigmoidoscopy) you may notice spotting of blood in your stool or on the toilet paper. If you underwent a bowel prep for your procedure, you may not have a normal bowel movement for a few days.  Please Note:  You might notice some irritation and congestion in your nose or some drainage.  This is from the oxygen used during your procedure.  There is no need for concern and it should clear up in a day or so.  SYMPTOMS TO REPORT IMMEDIATELY:   Following lower endoscopy (colonoscopy or flexible sigmoidoscopy):  Excessive amounts of blood in the stool  Significant tenderness or worsening of abdominal pains  Swelling of the abdomen that is new, acute  Fever of 100F or higher   For urgent or emergent issues, a gastroenterologist can be reached at any hour by calling [phone].   DIET:  We do recommend a small meal at first, but then you may proceed to your regular diet.  Drink plenty of fluids but you should avoid alcoholic beverages for 24 hours. Use orange flavored citrucel daily per Dr.  Ardis Hughs.  ACTIVITY:  You should plan to take it easy for the rest of today and you should NOT DRIVE or use heavy machinery until tomorrow (because of the sedation medicines used during the test).    FOLLOW UP: Our staff will call the number listed on your records 48-72 hours following your procedure to check on you and address any questions or concerns that you may have regarding the information given to you following your procedure. If we do not reach you, we will leave a message.  We will attempt to reach you two times.  During this call, we will ask if you have developed any symptoms of COVID 19. If you develop any symptoms (ie: fever, flu-like symptoms, shortness of breath, cough etc.) before then, please call [phone].  If you test positive for Covid 19 in the 2 weeks post procedure, please call and report this information to [place].    If any biopsies were taken you will be contacted by phone or by letter within the next 1-3 weeks.  Please call us at [phone] if you have not heard about the biopsies in 3 weeks.    SIGNATURES/CONFIDENTIALITY: You and/or your care partner have signed paperwork which will be entered into your electronic medical record.  These signatures attest to the fact that that the information above on your After Visit Summary has been reviewed and is understood.  Full responsibility of the confidentiality of this discharge information  lies with you and/or your care-partner.

## 2019-02-24 NOTE — Progress Notes (Signed)
Pt's states no medical or surgical changes since previsit or office visit.  Temp JB VS CW 

## 2019-02-26 ENCOUNTER — Telehealth: Payer: Self-pay

## 2019-02-26 NOTE — Telephone Encounter (Signed)
  Follow up Call-  Call back number 02/24/2019  Post procedure Call Back phone  # [phone]  Permission to leave phone message Yes  Some recent data might be hidden     Patient questions:  Do you have a fever, pain , or abdominal swelling? No. Pain Score  0 *  Have you tolerated food without any problems? Yes.    Have you been able to return to your normal activities? Yes.    Do you have any questions about your discharge instructions: Diet   No. Medications  No. Follow up visit  No.  Do you have questions or concerns about your Care? No.  Actions: * If pain score is 4 or above: No action needed, pain <4.  1. Have you developed a fever since your procedure? no  2.   Have you had an respiratory symptoms (SOB or cough) since your procedure? no  3.   Have you tested positive for COVID 19 since your procedure no  4.   Have you had any family members/close contacts diagnosed with the COVID 19 since your procedure?  no   If yes to any of these questions please route to Joylene John, RN and Alphonsa Gin, Therapist, sports.

## 2019-02-27 ENCOUNTER — Encounter: Payer: Self-pay | Admitting: Gastroenterology

## 2019-03-08 ENCOUNTER — Ambulatory Visit: Payer: Medicare HMO | Attending: Internal Medicine

## 2019-03-08 DIAGNOSIS — Z23 Encounter for immunization: Secondary | ICD-10-CM | POA: Insufficient documentation

## 2019-03-08 NOTE — Progress Notes (Signed)
   Covid-19 Vaccination Clinic  Name:  Cardae Sapp    MRN: [id number] DOB: 26-Oct-1950  03/08/2019  Mr. Lempke was observed post Covid-19 immunization for 15 minutes without incidence. He was provided with Vaccine Information Sheet and instruction to access the V-Safe system.   Mr. Einbinder was instructed to call 911 with any severe reactions post vaccine: Marland Kitchen Difficulty breathing  . Swelling of your face and throat  . A fast heartbeat  . A bad rash all over your body  . Dizziness and weakness    Immunizations Administered    Name Date Dose VIS Date Route   Pfizer COVID-19 Vaccine 03/08/2019  2:27 PM 0.3 mL 01/24/2019 Intramuscular   Manufacturer: [place]   Lot: [id number]   [place]: [id number]

## 2019-03-29 ENCOUNTER — Ambulatory Visit: Payer: Medicare HMO | Attending: Internal Medicine

## 2019-03-29 DIAGNOSIS — Z23 Encounter for immunization: Secondary | ICD-10-CM

## 2019-03-29 NOTE — Progress Notes (Signed)
   Covid-19 Vaccination Clinic  Name:  Kwanza Briski    MRN: [id number] DOB: 12-13-1950  03/29/2019  Mr. Olshansky was observed post Covid-19 immunization for 15 minutes without incidence. He was provided with Vaccine Information Sheet and instruction to access the V-Safe system.   Mr. Titone was instructed to call 911 with any severe reactions post vaccine: Marland Kitchen Difficulty breathing  . Swelling of your face and throat  . A fast heartbeat  . A bad rash all over your body  . Dizziness and weakness    Immunizations Administered    Name Date Dose VIS Date Route   Pfizer COVID-19 Vaccine 03/29/2019 12:44 PM 0.3 mL 01/24/2019 Intramuscular   Manufacturer: [place]   Lot: [id number]   [place]: [id number]

## 2019-09-19 ENCOUNTER — Other Ambulatory Visit: Payer: Self-pay | Admitting: Nurse Practitioner

## 2019-09-19 DIAGNOSIS — R413 Other amnesia: Secondary | ICD-10-CM

## 2019-10-13 ENCOUNTER — Other Ambulatory Visit: Payer: Medicare HMO

## 2019-10-14 ENCOUNTER — Ambulatory Visit
Admission: RE | Admit: 2019-10-14 | Discharge: 2019-10-14 | Disposition: A | Discharge status: Home / Self Care | Payer: Medicare HMO | Source: Ambulatory Visit | Attending: Nurse Practitioner | Admitting: Nurse Practitioner

## 2019-10-14 ENCOUNTER — Other Ambulatory Visit: Payer: Self-pay

## 2019-10-14 DIAGNOSIS — R413 Other amnesia: Secondary | ICD-10-CM

## 2019-10-14 IMAGING — MR MR HEAD WO/W CM
12 series · 48 of 48 positions shown · IV contrast (multihance)
Comparison: None.

CLINICAL DATA: Memory loss over the last 2 years.

EXAM:
MRI HEAD WITHOUT AND WITH CONTRAST
TECHNIQUE: Multiplanar, multiecho pulse sequences of the brain and surrounding
structures were obtained without and with intravenous contrast.
CONTRAST:  18mL MULTIHANCE GADOBENATE DIMEGLUMINE 529 MG/ML IV SOLN

[Series 5: T1 · sagittal · 4.0mm · 0.75mm/px · 1 of 31 slices shown (1 of 3)]
[im 1/31]
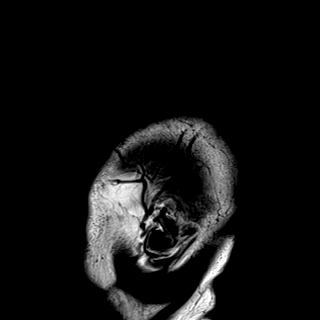

[Series 6: DWI · axial · 3.0mm · 1.44mm/px · z∈[-78,+74]mm · 5 of 94 slices shown (1 of 4)]
[im 1/94]
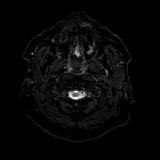
[im 24/94]
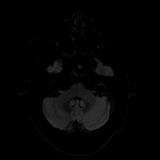
[im 47/94]
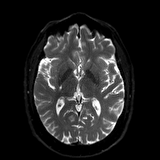
[im 70/94]
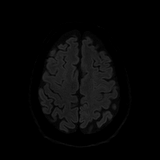
[im 94/94]
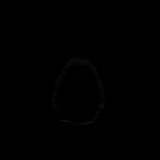

[Series 7: DWI · axial · 3.0mm · 1.44mm/px · z∈[-78,+74]mm · 3 of 46 slices shown (2 of 4)]
[im 1/46]
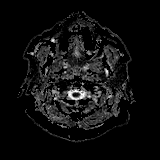
[im 23/46]
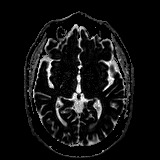
[im 46/46]
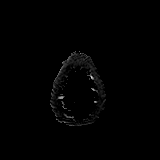

[Series 8: DWI · coronal · 5.0mm · 1.44mm/px · 4 of 64 slices shown (3 of 4)]
[im 1/64]
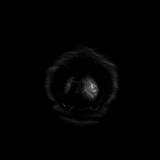
[im 22/64]
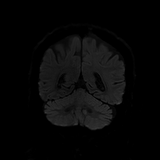
[im 43/64]
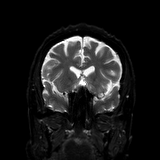
[im 64/64]
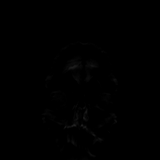

[Series 9: DWI · coronal · 5.0mm · 1.44mm/px · 2 of 32 slices shown (4 of 4)]
[im 1/32]
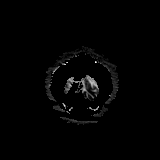
[im 32/32]
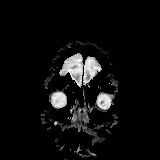

[Series 10: T2 · axial · 4.0mm · 0.36mm/px · z∈[-74,+71]mm · 2 of 29 slices shown]
[im 1/29]
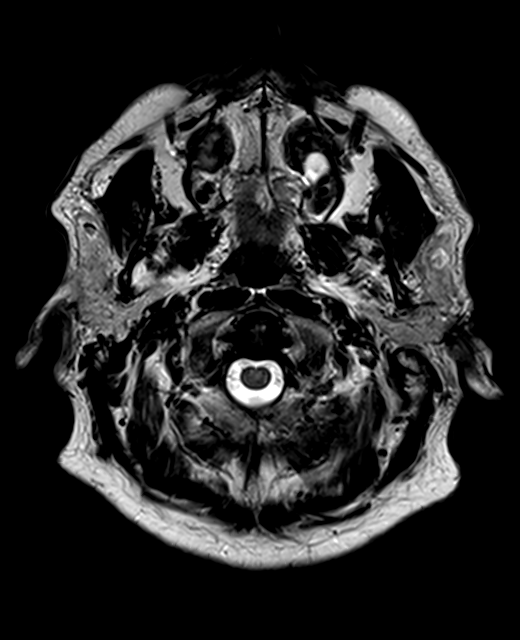
[im 29/29]
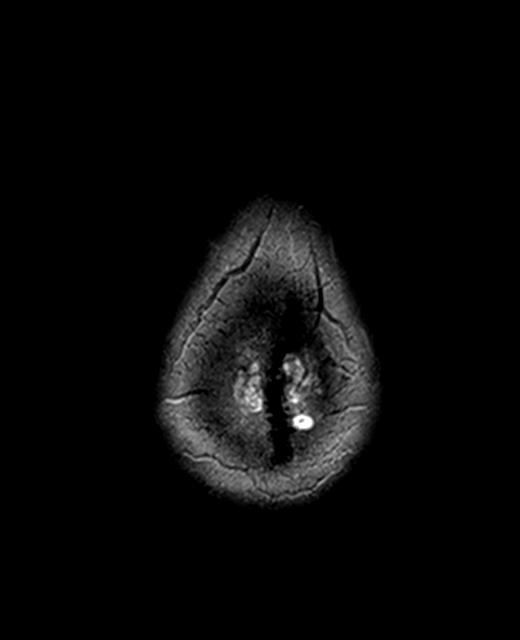

[Series 11: FLAIR · axial · 3.0mm · 0.72mm/px · 1 of 13 slices shown]
[im 1/13]
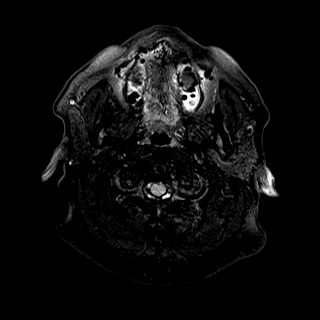

[Series 13: swi_images · axial · 1.5mm · 0.90mm/px · z∈[-72,+70]mm · 6 of 96 slices shown]
[im 1/96]
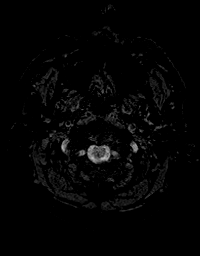
[im 20/96]
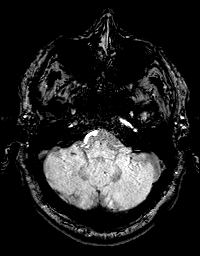
[im 39/96]
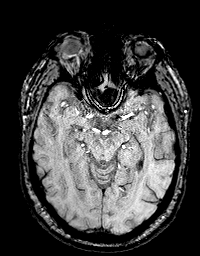
[im 58/96]
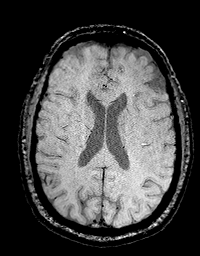
[im 77/96]
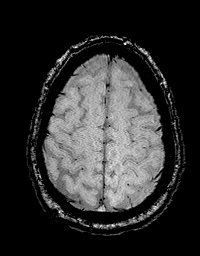
[im 96/96]
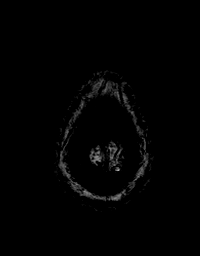

[Series 14: T1 · axial · 1.0mm · 0.94mm/px · z∈[-80,+78]mm · 10 of 160 slices shown (2 of 3)]
[im 1/160]
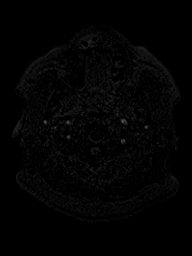
[im 18/160]
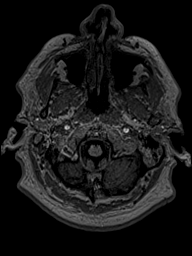
[im 36/160]
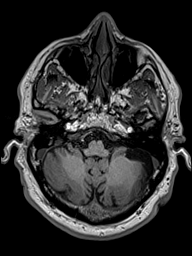
[im 54/160]
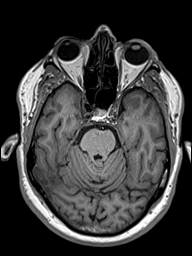
[im 71/160]
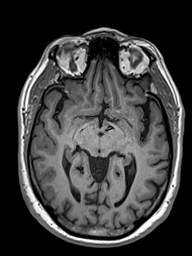
[im 89/160]
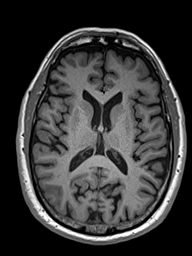
[im 107/160]
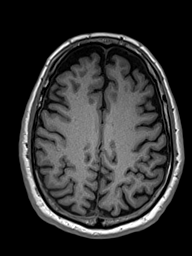
[im 124/160]
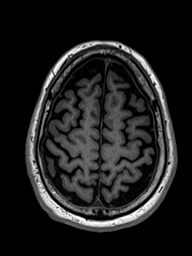
[im 142/160]
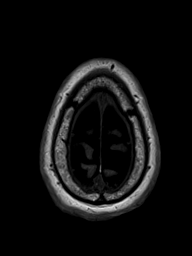
[im 160/160]
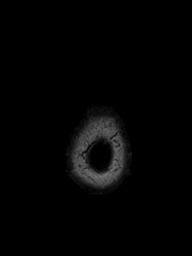

[Series 15: T2 post-contrast · coronal · 4.5mm · 0.36mm/px · 2 of 31 slices shown]
[im 1/31]
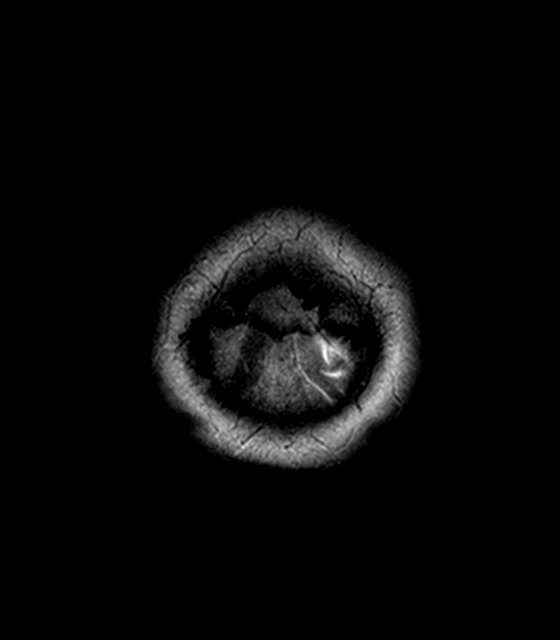
[im 31/31]
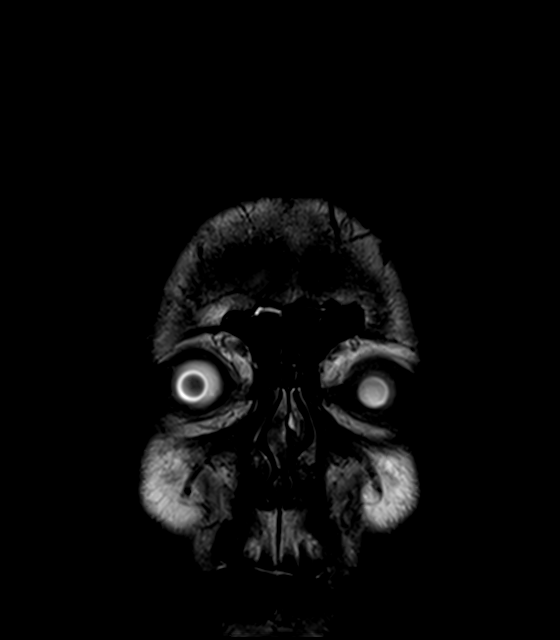

[Series 16: T1 · axial · 1.0mm · 0.94mm/px · z∈[-80,+78]mm · 10 of 160 slices shown (3 of 3)]
[im 1/160]
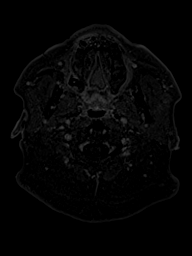
[im 18/160]
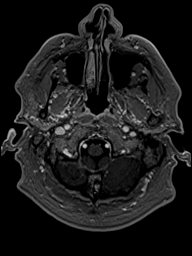
[im 36/160]
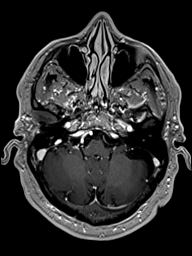
[im 54/160]
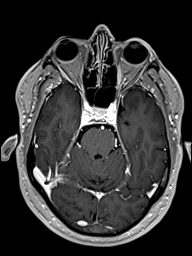
[im 71/160]
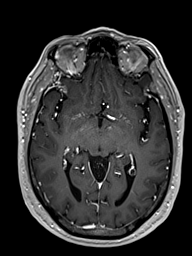
[im 89/160]
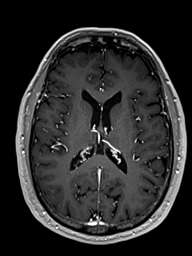
[im 107/160]
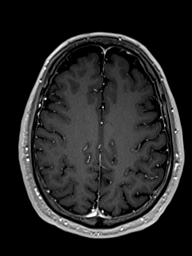
[im 124/160]
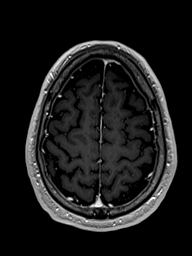
[im 142/160]
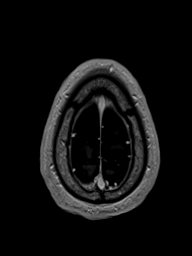
[im 160/160]
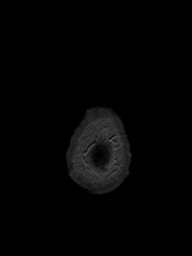

[Series 17: T1 post-contrast · coronal · 4.5mm · 0.72mm/px · 2 of 31 slices shown]
[im 1/31]
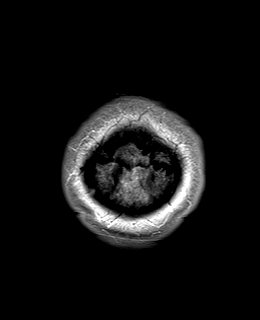
[im 31/31]
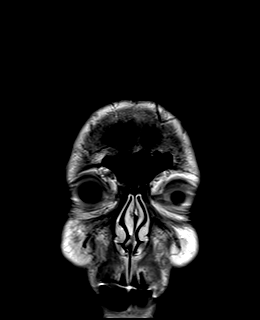

[48 of 48 positions shown; findings below may reference images not displayed]

FINDINGS: Brain: Diffusion imaging does not show any acute or subacute
abnormality. No abnormality affects the brainstem or cerebellum.
Cerebral hemispheres show mild age related volume loss. Single
punctate T2 and FLAIR bright focus within the left frontal
subcortical white matter. Patient does not have a pattern of
widespread small-vessel disease. No cortical or large vessel
territory infarction. No mass lesion, hemorrhage, hydrocephalus or
extra-axial collection. After contrast administration, no abnormal
enhancement occurs.

Vascular: Major vessels at the base of the brain show flow.

Skull and upper cervical spine: Negative

Sinuses/Orbits: Clear/normal

Other: None
IMPRESSION: No acute or reversible finding. Mild age related volume loss. Single
punctate T2 and FLAIR bright focus within the left frontal
subcortical white matter, not likely significant and less than often
seen at this age.

## 2019-10-14 MED ORDER — GADOBENATE DIMEGLUMINE 529 MG/ML IV SOLN
18.0000 mL | Freq: Once | INTRAVENOUS | Status: AC | PRN
  Administered 2019-10-14: 18 mL via INTRAVENOUS

## 2021-01-18 ENCOUNTER — Ambulatory Visit: Payer: Medicare HMO | Admitting: Nurse Practitioner

## 2021-01-18 ENCOUNTER — Other Ambulatory Visit (INDEPENDENT_AMBULATORY_CARE_PROVIDER_SITE_OTHER): Payer: Medicare HMO

## 2021-01-18 ENCOUNTER — Encounter: Payer: Self-pay | Admitting: Nurse Practitioner

## 2021-01-18 VITALS — BP 120/66 | HR 64 | Ht 66.0 in | Wt 197.0 lb

## 2021-01-18 DIAGNOSIS — D500 Iron deficiency anemia secondary to blood loss (chronic): Secondary | ICD-10-CM

## 2021-01-18 DIAGNOSIS — K31819 Angiodysplasia of stomach and duodenum without bleeding: Secondary | ICD-10-CM

## 2021-01-18 DIAGNOSIS — K5900 Constipation, unspecified: Secondary | ICD-10-CM | POA: Diagnosis not present

## 2021-01-18 LAB — CBC
HCT: 37.4 % — ABNORMAL LOW (ref 39.0–52.0)
Hemoglobin: 11.5 g/dL — ABNORMAL LOW (ref 13.0–17.0)
MCHC: 30.7 g/dL (ref 30.0–36.0)
MCV: 76.8 fl — ABNORMAL LOW (ref 78.0–100.0)
Platelets: 246 10*3/uL (ref 150.0–400.0)
RBC: 4.87 Mil/uL (ref 4.22–5.81)
RDW: 22.8 % — ABNORMAL HIGH (ref 11.5–15.5)
WBC: 6.6 10*3/uL (ref 4.0–10.5)

## 2021-01-18 NOTE — H&P (View-Only) (Signed)
ASSESSMENT AND PLAN    # 70 yo male with history of IDA and GAVE. Referred for recurrent anemia ( labs are not available). No overt GI bleeding. No stool or blood on DRE today.  --Labs from PCP requested. Obtain CBC today --Started on oral iron 3 weeks ago --Will schedule for EGD. The risks and benefits of EGD with possible biopsies were discussed with the patient who agrees to proceed. Anticipate need for APC so will schedule procedure to be done at [facility].   to be done at the hospital   # Painless rectal bleeding associated with constipation, improved after starting stool softeners.  --continue stool softeners --If he has recurrent bleeding we can try topical steroids.  --up to date on colonoscopy, 4 small polyps and internal hemorrhoids on last colonoscopy less than a year ago.   # GERD, asymptomatic on Dexilant  HISTORY OF PRESENT ILLNESS    Chief Complaint : anemia  Charles Carr is a 70 y.o. male known to Dr. Ardis Hughs with a past medical history of GERD, GAVE, kidney stones, and colon polyps.  Additional medical history as listed in [place] .   Charles Carr has a history of IDA, GAVE requiring APC ( last time in June 2019) and colon polyps.  He is here today at  the request of PCP for evaluation of recurrent anemia. I do not have any labs available for review but patient tells me PCP started him on iron 3 weeks ago for what sounds like a significant decline in hgb. No NSAID use. His stools are now dark on oral iron but were normal color beforehand. He has occasional rectal bleeding associated with constipation. Generally blood loss is minor but  two weeks ago he passed more blood than usual and it caused him alarm. Wife has since started him on stool softeners which has been very helpful. He has had mild dizziness but otherwise feels okay.      PREVIOUS ENDOSCOPIC EVALUATIONS / PERTINENT STUDIES:   June 2019 EGD for IDA -Typical appearing GAVE (overall much improved since 2016  examinations). Treated with application of argon plasma coagulation (APC) today. - The examination was otherwise normal.  Jan 2021 Colonoscopy  Four 2 to 5 mm polyps in the sigmoid colon, in the descending colon and in the transverse colon, removed with a cold snare. Resected and retrieved. - Internal hemorrhoids. - The examination was otherwise normal on direct and retroflexion views.  Surgical [P], colon, transverse, descending, sigmoid, polyp (4) - TUBULAR ADENOMA (2 OF 4 FRAGMENTS) - SESSILE SERRATED POLYP (1 OF 4 FRAGMENTS) - BENIGN COLONIC MUCOSA (1 OF 4 FRAGMENTS) - NO HIGH GRADE DYSPLASIA OR MALIGNANCY IDENTIFIED DAWN  Current Medications, Allergies, Past Medical History, Past Surgical History, Family History and Social History were reviewed in [employer] record.     Current Outpatient Medications  Medication Sig Dispense Refill   ALPRAZolam (XANAX) 0.25 MG tablet Take 0.25 mg by mouth daily as needed for anxiety.   1   dexlansoprazole (DEXILANT) 60 MG capsule Take 60 mg by mouth daily.     donepezil (ARICEPT) 10 MG tablet Take 10 mg by mouth daily.     Ferrous Sulfate (IRON PO) Take by mouth daily.     rosuvastatin (CRESTOR) 5 MG tablet Take 5 mg by mouth daily.     No current facility-administered medications for this visit.    Review of Systems: No chest pain. No shortness of breath. No urinary complaints.  PHYSICAL EXAM :    Wt Readings from Last 3 Encounters:  01/18/21 197 lb (89.4 kg)  02/24/19 195 lb 3.2 oz (88.5 kg)  02/10/19 195 lb 3.2 oz (88.5 kg)    BP 120/66    Pulse 64    Ht 5\' 6"  (1.676 m)    Wt 197 lb (89.4 kg)    BMI 31.80 kg/m  Constitutional:  Generally well appearing male in no acute distress. Psychiatric: Pleasant. Normal mood and affect. Behavior is normal. EENT: Pupils normal.  Conjunctivae are normal. No scleral icterus. Neck supple.  Cardiovascular: Normal rate, regular rhythm. No edema Pulmonary/chest: Effort  normal and breath sounds normal. No wheezing, rales or rhonchi. Abdominal: Soft, nondistended, nontender. Bowel sounds active throughout. There are no masses palpable. No hepatomegaly. Rectal:  no blood or stool in vault. Gloved finger heme negative. Protruding internal hemorrhoids Neurological: Alert and oriented to person place and time. Skin: Skin is warm and dry. No rashes noted.  Tye Savoy, NP  01/18/2021, 2:13 PM  Cc:  Charlynn Court, NP

## 2021-01-18 NOTE — Patient Instructions (Addendum)
LABS:  Lab work has been ordered for you today. Our lab is located in the basement. Press "B" on the elevator. The lab is located at the first door on the left as you exit the elevator.  You will be contacted by a staff member regarding scheduling the EGD at the hospital with Dr. Ardis Hughs.  It was great seeing you today! Thank you for entrusting me with your care and choosing [facility].  Tye Savoy, NP  The [facility] providers would like to encourage you to use [facility] to communicate with providers for non-urgent requests or questions.  Due to long hold times on the telephone, sending your provider a message by [facility] may be faster and more efficient way to get a response. Please allow 48 business hours for a response.  Please remember that this is for non-urgent requests/questions.  If you are age 35 or older, your body mass index should be between 23-30. Your Body mass index is 31.8 kg/m. If this is out of the aforementioned range listed, please consider follow up with your Primary Care Provider.  If you are age 3 or younger, your body mass index should be between 19-25. Your Body mass index is 31.8 kg/m. If this is out of the aformentioned range listed, please consider follow up with your Primary Care Provider.

## 2021-01-18 NOTE — Progress Notes (Signed)
ASSESSMENT AND PLAN    # 70 yo male with history of IDA and GAVE. Referred for recurrent anemia ( labs are not available). No overt GI bleeding. No stool or blood on DRE today.  --Labs from PCP requested. Obtain CBC today --Started on oral iron 3 weeks ago --Will schedule for EGD. The risks and benefits of EGD with possible biopsies were discussed with the patient who agrees to proceed. Anticipate need for APC so will schedule procedure to be done at [facility].   to be done at the hospital   # Painless rectal bleeding associated with constipation, improved after starting stool softeners.  --continue stool softeners --If he has recurrent bleeding we can try topical steroids.  --up to date on colonoscopy, 4 small polyps and internal hemorrhoids on last colonoscopy less than a year ago.   # GERD, asymptomatic on Dexilant  HISTORY OF PRESENT ILLNESS    Chief Complaint : anemia  Charles Carr is a 70 y.o. male known to Dr. Ardis Carr with a past medical history of GERD, GAVE, kidney stones, and colon polyps.  Additional medical history as listed in Charles Carr .   Mr. Charles Carr has a history of IDA, GAVE requiring APC ( last time in June 2019) and colon polyps.  He is here today at  the request of PCP for evaluation of recurrent anemia. I do not have any labs available for review but patient tells me PCP started him on iron 3 weeks ago for what sounds like a significant decline in hgb. No NSAID use. His stools are now dark on oral iron but were normal color beforehand. He has occasional rectal bleeding associated with constipation. Generally blood loss is minor but  two weeks ago he passed more blood than usual and it caused him alarm. Wife has since started him on stool softeners which has been very helpful. He has had mild dizziness but otherwise feels okay.      PREVIOUS ENDOSCOPIC EVALUATIONS / PERTINENT STUDIES:   June 2019 EGD for IDA -Typical appearing GAVE (overall much improved since 2016  examinations). Treated with application of argon plasma coagulation (APC) today. - The examination was otherwise normal.  Jan 2021 Colonoscopy  Four 2 to 5 mm polyps in the sigmoid colon, in the descending colon and in the transverse colon, removed with a cold snare. Resected and retrieved. - Internal hemorrhoids. - The examination was otherwise normal on direct and retroflexion views.  Surgical [P], colon, transverse, descending, sigmoid, polyp (4) - TUBULAR ADENOMA (2 OF 4 FRAGMENTS) - SESSILE SERRATED POLYP (1 OF 4 FRAGMENTS) - BENIGN COLONIC MUCOSA (1 OF 4 FRAGMENTS) - NO HIGH GRADE DYSPLASIA OR MALIGNANCY IDENTIFIED Charles Carr  Current Medications, Allergies, Past Medical History, Past Surgical History, Family History and Social History were reviewed in [employer] record.     Current Outpatient Medications  Medication Sig Dispense Refill   ALPRAZolam (XANAX) 0.25 MG tablet Take 0.25 mg by mouth daily as needed for anxiety.   1   dexlansoprazole (DEXILANT) 60 MG capsule Take 60 mg by mouth daily.     donepezil (ARICEPT) 10 MG tablet Take 10 mg by mouth daily.     Ferrous Sulfate (IRON PO) Take by mouth daily.     rosuvastatin (CRESTOR) 5 MG tablet Take 5 mg by mouth daily.     No current facility-administered medications for this visit.    Review of Systems: No chest pain. No shortness of breath. No urinary complaints.  PHYSICAL EXAM :    Wt Readings from Last 3 Encounters:  01/18/21 197 lb (89.4 kg)  02/24/19 195 lb 3.2 oz (88.5 kg)  02/10/19 195 lb 3.2 oz (88.5 kg)    BP 120/66   Pulse 64   Ht 5\' 6"  (1.676 m)   Wt 197 lb (89.4 kg)   BMI 31.80 kg/m  Constitutional:  Generally well appearing male in no acute distress. Psychiatric: Pleasant. Normal mood and affect. Behavior is normal. EENT: Pupils normal.  Conjunctivae are normal. No scleral icterus. Neck supple.  Cardiovascular: Normal rate, regular rhythm. No edema Pulmonary/chest: Effort  normal and breath sounds normal. No wheezing, rales or rhonchi. Abdominal: Soft, nondistended, nontender. Bowel sounds active throughout. There are no masses palpable. No hepatomegaly. Rectal:  no blood or stool in vault. Gloved finger heme negative. Protruding internal hemorrhoids Neurological: Alert and oriented to person place and time. Skin: Skin is warm and dry. No rashes noted.  Charles Savoy, NP  01/18/2021, 2:13 PM  Cc:  Charles Court, NP

## 2021-01-19 ENCOUNTER — Telehealth: Payer: Self-pay

## 2021-01-19 ENCOUNTER — Other Ambulatory Visit: Payer: Self-pay

## 2021-01-19 DIAGNOSIS — D500 Iron deficiency anemia secondary to blood loss (chronic): Secondary | ICD-10-CM

## 2021-01-19 NOTE — Telephone Encounter (Signed)
Spoke to pt regarding EGD that is scheduled at [facility] with DJ on 02/17/21 at 8:30 am. Pt was given instructions, and a letter has been sent to pt.

## 2021-01-19 NOTE — Telephone Encounter (Signed)
Author: Milus Banister, MD Service: Gastroenterology Author Type: Physician  Filed: 01/19/2021  5:53 AM Encounter Date: 01/18/2021 Status: Signed  Editor: Milus Banister, MD (Physician)        I agree with thte above note, plan.   Morenike Cuff, please offer him 8:30 THurday JAnuary 5th (looks like there is time after my currently scheduled 7:30 case.  thanks

## 2021-01-19 NOTE — Telephone Encounter (Signed)
-----   Message from Milus Banister, MD sent at 01/19/2021  5:52 AM EST -----    ----- Message ----- From: Willia Craze, NP Sent: 01/18/2021   9:50 PM EST To: Milus Banister, MD

## 2021-01-19 NOTE — Progress Notes (Signed)
I agree with thte above note, plan.  Patty, please offer him 8:30 THurday JAnuary 5th (looks like there is time after my currently scheduled 7:30 case.  thanks

## 2021-02-03 ENCOUNTER — Encounter (HOSPITAL_COMMUNITY): Payer: Self-pay | Admitting: Gastroenterology

## 2021-02-16 ENCOUNTER — Encounter (HOSPITAL_COMMUNITY): Payer: Self-pay | Admitting: Gastroenterology

## 2021-02-17 ENCOUNTER — Ambulatory Visit (HOSPITAL_COMMUNITY): Payer: Medicare HMO | Admitting: Anesthesiology

## 2021-02-17 ENCOUNTER — Encounter (HOSPITAL_COMMUNITY): Payer: Self-pay | Admitting: Gastroenterology

## 2021-02-17 ENCOUNTER — Encounter (HOSPITAL_COMMUNITY)
Admission: RE | Disposition: A | Discharge status: Home / Self Care | Payer: Self-pay | Source: Ambulatory Visit | Attending: Gastroenterology

## 2021-02-17 ENCOUNTER — Other Ambulatory Visit: Payer: Self-pay

## 2021-02-17 ENCOUNTER — Ambulatory Visit (HOSPITAL_COMMUNITY)
Admission: RE | Admit: 2021-02-17 | Discharge: 2021-02-17 | Disposition: A | Discharge status: Home / Self Care | Payer: Medicare HMO | Source: Ambulatory Visit | Attending: Gastroenterology | Admitting: Gastroenterology

## 2021-02-17 DIAGNOSIS — K625 Hemorrhage of anus and rectum: Secondary | ICD-10-CM | POA: Diagnosis not present

## 2021-02-17 DIAGNOSIS — K219 Gastro-esophageal reflux disease without esophagitis: Secondary | ICD-10-CM | POA: Diagnosis not present

## 2021-02-17 DIAGNOSIS — K5900 Constipation, unspecified: Secondary | ICD-10-CM | POA: Insufficient documentation

## 2021-02-17 DIAGNOSIS — D509 Iron deficiency anemia, unspecified: Secondary | ICD-10-CM | POA: Diagnosis present

## 2021-02-17 DIAGNOSIS — Z87891 Personal history of nicotine dependence: Secondary | ICD-10-CM | POA: Diagnosis not present

## 2021-02-17 DIAGNOSIS — Z8719 Personal history of other diseases of the digestive system: Secondary | ICD-10-CM | POA: Diagnosis not present

## 2021-02-17 DIAGNOSIS — K31819 Angiodysplasia of stomach and duodenum without bleeding: Secondary | ICD-10-CM | POA: Insufficient documentation

## 2021-02-17 DIAGNOSIS — Z79899 Other long term (current) drug therapy: Secondary | ICD-10-CM | POA: Diagnosis not present

## 2021-02-17 DIAGNOSIS — Z87442 Personal history of urinary calculi: Secondary | ICD-10-CM | POA: Insufficient documentation

## 2021-02-17 HISTORY — PX: ESOPHAGOGASTRODUODENOSCOPY (EGD) WITH PROPOFOL: SHX5813

## 2021-02-17 HISTORY — PX: HOT HEMOSTASIS: SHX5433

## 2021-02-17 SURGERY — ESOPHAGOGASTRODUODENOSCOPY (EGD) WITH PROPOFOL
Anesthesia: Monitor Anesthesia Care

## 2021-02-17 MED ORDER — LIDOCAINE HCL (CARDIAC) PF 100 MG/5ML IV SOSY
PREFILLED_SYRINGE | INTRAVENOUS | Status: DC | PRN
  Administered 2021-02-17: 80 mg via INTRAVENOUS

## 2021-02-17 MED ORDER — LACTATED RINGERS IV SOLN
INTRAVENOUS | Status: AC | PRN
  Administered 2021-02-17: 1000 mL via INTRAVENOUS

## 2021-02-17 MED ORDER — PROPOFOL 500 MG/50ML IV EMUL
INTRAVENOUS | Status: DC | PRN
  Administered 2021-02-17: 125 ug/kg/min via INTRAVENOUS

## 2021-02-17 MED ORDER — PROPOFOL 500 MG/50ML IV EMUL
INTRAVENOUS | Status: DC | PRN
  Administered 2021-02-17: 40 mg via INTRAVENOUS

## 2021-02-17 SURGICAL SUPPLY — 15 items

## 2021-02-17 NOTE — Anesthesia Postprocedure Evaluation (Signed)
Anesthesia Post Note  Patient: Charles Carr  Procedure(s) Performed: ESOPHAGOGASTRODUODENOSCOPY (EGD) WITH PROPOFOL HOT HEMOSTASIS (ARGON PLASMA COAGULATION/BICAP)     Patient location during evaluation: Endoscopy Anesthesia Type: MAC Level of consciousness: awake Pain management: pain level controlled Vital Signs Assessment: post-procedure vital signs reviewed and stable Respiratory status: spontaneous breathing Postop Assessment: no apparent nausea or vomiting Anesthetic complications: no   No notable events documented.  Last Vitals:  Vitals:   02/17/21 1002 02/17/21 1012  BP: (!) 152/74 (!) 166/73  Pulse: (!) 56 (!) 56  Resp: 16 16  Temp:    SpO2: 96% 98%    Last Pain:  Vitals:   02/17/21 1012  TempSrc:   PainSc: 0-No pain                 Charles Carr

## 2021-02-17 NOTE — Discharge Instructions (Signed)
YOU HAD AN ENDOSCOPIC PROCEDURE TODAY: Refer to the procedure report and other information in the discharge instructions given to you for any specific questions about what was found during the examination. If this information does not answer your questions, please call [place] office at [phone] to clarify.  ° °YOU SHOULD EXPECT: Some feelings of bloating in the abdomen. Passage of more gas than usual. Walking can help get rid of the air that was put into your GI tract during the procedure and reduce the bloating.  ° °DIET: Your first meal following the procedure should be a light meal and then it is ok to progress to your normal diet. A half-sandwich or bowl of soup is an example of a good first meal. Heavy or fried foods are harder to digest and may make you feel nauseous or bloated. Drink plenty of fluids but you should avoid alcoholic beverages for 24 hours. ° °ACTIVITY: Your care partner should take you home directly after the procedure. You should plan to take it easy, moving slowly for the rest of the day. You can resume normal activity the day after the procedure however YOU SHOULD NOT DRIVE, use power tools, machinery or perform tasks that involve climbing or major physical exertion for 24 hours (because of the sedation medicines used during the test).  ° °SYMPTOMS TO REPORT IMMEDIATELY: °A gastroenterologist can be reached at any hour. Please call [phone]  for any of the following symptoms:  ° °Following upper endoscopy (EGD, EUS, ERCP, esophageal dilation) °Vomiting of blood or coffee ground material  °New, significant abdominal pain  °New, significant chest pain or pain under the shoulder blades  °Painful or persistently difficult swallowing  °New shortness of breath  °Black, tarry-looking or red, bloody stools ° °FOLLOW UP:  °If any biopsies were taken you will be contacted by phone or by letter within the next 1-3 weeks. Call [phone]  if you have not heard about the biopsies in 3 weeks.    °Please also call with any specific questions about appointments or follow up tests. ° °

## 2021-02-17 NOTE — Interval H&P Note (Signed)
History and Physical Interval Note:  02/17/2021 8:07 AM  Charles Carr  has presented today for surgery, with the diagnosis of IDA.  The various methods of treatment have been discussed with the patient and family. After consideration of risks, benefits and other options for treatment, the patient has consented to  Procedure(s): ESOPHAGOGASTRODUODENOSCOPY (EGD) WITH PROPOFOL (N/A) as a surgical intervention.  [name] history has been reviewed, patient examined, no change in status, stable for surgery.  I have reviewed the patient's chart and labs.  Questions were answered to the patient's satisfaction.     Milus Banister

## 2021-02-17 NOTE — Op Note (Signed)
[facility] Patient Name: Charles Carr Procedure Date: 02/17/2021 MRN: [id number] Attending MD: Milus Banister , MD Date of Birth: 1950/10/30 CSN: [id number] Age: 71 Admit Type: Outpatient Procedure:                Upper GI endoscopy Indications:              Iron deficiency anemia; Iron deficiency anemia                            (mild) from chronic GAVE: Originally referred by                            Dr. Sandria Senter, patient had been requiring                            iron infusions. EGD with APC treatment Dr. Ardis Hughs                            02/2014, 04/2014, 02/2016 ([place] BICAP), 07/2017;                            Late 2022 Hb in 10's (he stopped oral iron                            supplement), improved to 11's on Providers:                Milus Banister, MD, Carlyn Reichert, RN, [facility]                            Technician, Technician Referring MD:              Medicines:                Monitored Anesthesia Care Complications:            No immediate complications. Estimated blood loss:                            None. Estimated Blood Loss:     Estimated blood loss: none. Procedure:                Pre-Anesthesia Assessment:                           - Prior to the procedure, a History and Physical                            was performed, and patient medications and                            allergies were reviewed. [name] tolerance of                            previous anesthesia was also reviewed. The risks  and benefits of the procedure and the sedation                            options and risks were discussed with the patient.                            All questions were answered, and informed consent                            was obtained. Prior Anticoagulants: The patient has                            taken no previous anticoagulant or antiplatelet                            agents. ASA Grade  Assessment: II - A patient with                            mild systemic disease. After reviewing the risks                            and benefits, the patient was deemed in                            satisfactory condition to undergo the procedure.                           After obtaining informed consent, the endoscope was                            passed under direct vision. Throughout the                            procedure, the patient's blood pressure, pulse, and                            oxygen saturations were monitored continuously. The                            [id number]-[id number] ([id number]) Olympus endoscope was introduced                            through the mouth, and advanced to the second part                            of duodenum. The upper GI endoscopy was                            accomplished without difficulty. The patient                            tolerated the procedure well. Scope In: Scope Out: Findings:      Typical appearing, rather mild changes of GAVE throughout the  gastric       antrum were noted. I treated this to destruction with application of APC.      The exam was otherwise without abnormality. Impression:               - Gastric antral vascular ectasia, treated with                            argon plasma coagulation.                           - The examination was otherwise normal. Moderate Sedation:      Not Applicable - Patient had care per Anesthesia. Recommendation:           - Patient has a contact number available for                            emergencies. The signs and symptoms of potential                            delayed complications were discussed with the                            patient. Return to normal activities tomorrow.                            Written discharge instructions were provided to the                            patient.                           - Resume previous diet.                           - Continue present  medications. Stay on once daily                            oral iron supplement, this will descreas your                            chances to need repeat endoscopy.                           - My office will arrange repeat labs (cbc) in 2                            months. Procedure Code(s):        --- Professional ---                           [phone], Esophagogastroduodenoscopy, flexible,                            transoral; with ablation of tumor(s), polyp(s), or  other lesion(s) (includes pre- and post-dilation                            and guide wire passage, when performed) Diagnosis Code(s):        --- Professional ---                           K31.819, Angiodysplasia of stomach and duodenum                            without bleeding                           D50.9, Iron deficiency anemia, unspecified CPT copyright 2019 American Medical Association. All rights reserved. The codes documented in this report are preliminary and upon coder review may  be revised to meet current compliance requirements. Milus Banister, MD 02/17/2021 9:59:11 AM This report has been signed electronically. Number of Addenda: 0

## 2021-02-17 NOTE — Transfer of Care (Signed)
Immediate Anesthesia Transfer of Care Note  Patient: Charles Carr  Procedure(s) Performed: Procedure(s): ESOPHAGOGASTRODUODENOSCOPY (EGD) WITH PROPOFOL (N/A) HOT HEMOSTASIS (ARGON PLASMA COAGULATION/BICAP) (N/A)  Patient Location: PACU  Anesthesia Type:MAC  Level of Consciousness:  sedated, patient cooperative and responds to stimulation  Airway & Oxygen Therapy:Patient Spontanous Breathing and Patient connected to face mask oxgen  Post-op Assessment:  Report given to PACU RN and Post -op Vital signs reviewed and stable  Post vital signs:  Reviewed and stable  Last Vitals:  Vitals:   02/17/21 0824  BP: (!) 157/83  Pulse: 62  Resp: 20  Temp: (!) 36.3 C  SpO2: 676%    Complications: No apparent anesthesia complications

## 2021-02-17 NOTE — Anesthesia Preprocedure Evaluation (Signed)
Anesthesia Evaluation  Patient identified by MRN, date of birth, ID band Patient awake    Reviewed: Allergy & Precautions, NPO status , Patient's Chart, lab work & pertinent test results  Airway Mallampati: II  TM Distance: >3 FB     Dental   Pulmonary former smoker,    breath sounds clear to auscultation       Cardiovascular negative cardio ROS   Rhythm:Regular Rate:Normal     Neuro/Psych    GI/Hepatic Neg liver ROS, GERD  ,History noted Dr.Nivedita Mirabella   Endo/Other  negative endocrine ROS  Renal/GU negative Renal ROS     Musculoskeletal   Abdominal   Peds  Hematology   Anesthesia Other Findings   Reproductive/Obstetrics                             Anesthesia Physical Anesthesia Plan  ASA: 3  Anesthesia Plan: MAC   Post-op Pain Management:    Induction: Intravenous  PONV Risk Score and Plan: Propofol infusion and Treatment may vary due to age or medical condition  Airway Management Planned:   Additional Equipment:   Intra-op Plan:   Post-operative Plan:   Informed Consent: I have reviewed the patients History and Physical, chart, labs and discussed the procedure including the risks, benefits and alternatives for the proposed anesthesia with the patient or authorized representative who has indicated his/her understanding and acceptance.     Dental advisory given  Plan Discussed with: CRNA and Anesthesiologist  Anesthesia Plan Comments:         Anesthesia Quick Evaluation

## 2021-02-18 ENCOUNTER — Encounter (HOSPITAL_COMMUNITY): Payer: Self-pay | Admitting: Gastroenterology

## 2021-02-22 ENCOUNTER — Telehealth: Payer: Self-pay

## 2021-02-22 NOTE — Telephone Encounter (Signed)
-----   Message from Milus Banister, MD sent at 02/17/2021 10:00 AM EST ----- He needs cbc in 2 months, remind him he needs to stay on iron once daily.  Thanks

## 2021-02-22 NOTE — Telephone Encounter (Signed)
Patient reminded that he should take Iron once daily and recheck CBC in 2 months (March 2023).  Patient requested to have reminder call in March for lab work.  I sent myself a Staff Message to call the patient.  Patient agreed to plan and verbalized understanding.  No further questions.

## 2021-03-08 ENCOUNTER — Encounter: Payer: Self-pay | Admitting: Physician Assistant

## 2021-03-14 ENCOUNTER — Other Ambulatory Visit: Payer: Self-pay

## 2021-03-14 ENCOUNTER — Encounter: Payer: Self-pay | Admitting: Physician Assistant

## 2021-03-14 ENCOUNTER — Other Ambulatory Visit (INDEPENDENT_AMBULATORY_CARE_PROVIDER_SITE_OTHER): Payer: Medicare HMO

## 2021-03-14 ENCOUNTER — Ambulatory Visit: Payer: Medicare HMO | Admitting: Physician Assistant

## 2021-03-14 VITALS — BP 132/83 | HR 65 | Ht 66.0 in | Wt 191.4 lb

## 2021-03-14 DIAGNOSIS — F0280 Dementia in other diseases classified elsewhere without behavioral disturbance: Secondary | ICD-10-CM | POA: Diagnosis not present

## 2021-03-14 DIAGNOSIS — G309 Alzheimer's disease, unspecified: Secondary | ICD-10-CM | POA: Diagnosis not present

## 2021-03-14 DIAGNOSIS — R413 Other amnesia: Secondary | ICD-10-CM

## 2021-03-14 LAB — CBC
HCT: 45.3 % (ref 39.0–52.0)
Hemoglobin: 14.3 g/dL (ref 13.0–17.0)
MCHC: 31.6 g/dL (ref 30.0–36.0)
MCV: 80.2 fl (ref 78.0–100.0)
Platelets: 241 10*3/uL (ref 150.0–400.0)
RBC: 5.64 Mil/uL (ref 4.22–5.81)
RDW: 19.9 % — ABNORMAL HIGH (ref 11.5–15.5)
WBC: 6.8 10*3/uL (ref 4.0–10.5)

## 2021-03-14 LAB — TSH: TSH: 1.39 u[IU]/mL (ref 0.35–5.50)

## 2021-03-14 LAB — FERRITIN: Ferritin: 16 ng/mL — ABNORMAL LOW (ref 22.0–322.0)

## 2021-03-14 LAB — FOLATE: Folate: 10.3 ng/mL (ref >5.9)

## 2021-03-14 LAB — VITAMIN B12: Vitamin B-12: 202 pg/mL — ABNORMAL LOW (ref 211–911)

## 2021-03-14 NOTE — Progress Notes (Addendum)
Please inform patient that he has anemia called microcytic. In addition, he has B12 deficiency.  Will send the labs to the PCP for further management. Thanks

## 2021-03-14 NOTE — Progress Notes (Addendum)
Assessment/Plan:   Charles Carr is a very pleasant 71 y.o. year old RH male with risk factors including GAD, history of Iron Deficiency Anemia and GAVE ( Gastric Antral Vascular Ectasia) followed up by GI, history of anemia and colon polyps, seen today for evaluation of memory loss. Patient is currently on donepezil initially at 5 mg since March until December 2022, then increased to 10 mg nightly by PCP as he noted progression of his cognitive deficiencies.  MoCA today is 17/30, with deficiencies in visuospatial executive, language, and delayed recall 0/5.  Orientation is 4/6, findings suspicious for dementia likely due to Alzheimer's disease.   Recommendations:   Dementia due to Alzheimer's disease without behavioral disturbance  MRI brain without contrast to assess for underlying structural abnormality and assess vascular load  Neurocognitive testing to further evaluate cognitive concerns and determine other underlying cause of memory changes, including potential contribution from sleep, anxiety, or depression  Check B12, TSH, anemia panel Continue donepezil 10 mg daily as prescribed by PCP Discussed safety both in and out of the home.  Discussed the importance of regular daily schedule with inclusion of crossword puzzles to maintain brain function.  Continue to monitor mood with PCP.  Stay active at least 30 minutes at least 3 times a week.  Naps should be scheduled and should be no longer than 60 minutes and should not occur after 2 PM.  Control cardiovascular risk factors  Mediterranean diet is recommended  Folllow up in 3 months and after neurocognitive testing   Subjective:    The patient is seen in neurologic consultation at the request of Charlynn Court, NP for the evaluation of memory.  The patient is accompanied by his wife who supplements the history. This is a 71 y.o. year old RH  male who has had memory issues for about 2 years, when he began noticing he could not  remember what his wife had told him in the recent conversation.  He also began to forget his on passwords, symptoms that had been worsening over the last year.  He initially visited his PCP in August 2021 with these complaints, had an MRI of the brain which was showing mild volume changes, otherwise negative.  His PCP placed him on donepezil 5 mg daily in March 2022, but as his memory changes were more evident, as of December 2022 this was increased to 10 mg daily.  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  Despite cauterization, he continued to have problems with short-term memory.  He denies leaving objects in unusual places.  He continues to drive and denies getting lost.  He denies any mood changes or depression.  He does have a history of GAD, well controlled with antianxiety medications.  He is not very active, never exercised much but recently he got a dog, and he is walking on a daily basis.  He does not like to read, do crossword puzzles or word finding.  He sleeps well.  His wife reports that for the last 10 years, he has been acting out in his dreams, at times moving his hands, at one point it was worse, "is better now since the pill ".  He denies hallucinations or paranoia.  There are no hygiene concerns, he is independent of bathing and dressing.  He is in charge of the medications, and denies  missing any doses.  He is also in charge of the bills, and denies missing any payments.  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  His appetite is not as good as it was before according to him.  He denies trouble swallowing.  He does not cook.  He ambulates without difficulty, denies any falls or ever having a head injury.   He has intermittent, chronic frontal headaches, about twice a week, takes Tylenol with resolution of it.  No photophobia, nausea or vomiting with it.  He denies double vision, dizziness, focal numbness, tingling, unilateral weakness, or anosmia.  He had COVID in March 2022.  He has mild right intention tremors, present for several years.  No history of seizures.  He denies urine incontinence, retention, constipation or diarrhea.  He denies sleep apnea, alcohol or tobacco history.  Family history remarkable for mother and father with Alzheimer's disease.   MRI of the brain on 10/14/2019 was remarkable for no acute findings and mild age related volume loss. Single punctate T2 and FLAIR bright focus within the left frontal subcortical white matter, not likely significant and less than often seen at this age.  Labs 01/18/2021 remarkable for hemoglobin 11.5, hematocrit 37.4, MCV is low at 76.8 suspicious for microcytic anemia  No Known Allergies  Current Outpatient Medications  Medication Instructions   acetaminophen (TYLENOL) 1,000 mg, Oral, Every 6 hours PRN   ALPRAZolam (XANAX) 0.25 mg, Oral, Every morning   dexlansoprazole (DEXILANT) 60 mg, Oral, Every morning   donepezil (ARICEPT) 10 mg, Oral, Daily at bedtime   ferrous sulfate 325 mg, Oral, Every morning   rosuvastatin (CRESTOR) 5 mg, Oral, Every morning     VITALS:   Vitals:   03/14/21 0950  BP: 132/83  Pulse: 65  SpO2: 97%  Weight: 191 lb 6.4 oz (86.8 kg)  Height: 5\' 6"  (1.676 m)   No flowsheet data found.  PHYSICAL EXAM   HEENT:  Normocephalic, atraumatic. The mucous membranes are moist. The superficial temporal arteries are without ropiness or tenderness. Cardiovascular: Regular rate and rhythm. Lungs: Clear to auscultation bilaterally. Neck: There are no carotid bruits noted bilaterally.  NEUROLOGICAL: Montreal Cognitive Assessment  03/14/2021  Visuospatial/ Executive (0/5) 2  Naming (0/3) 3  Attention: Read list of  digits (0/2) 2  Attention: Read list of letters (0/1) 1  Attention: Serial 7 subtraction starting at 100 (0/3) 2  Language: Repeat phrase (0/2) 1  Language : Fluency (0/1) 0  Abstraction (0/2) 1  Delayed Recall (0/5) 0  Orientation (0/6) 4  Total 16  Adjusted Score (based on education) 17   No flowsheet data found.  No flowsheet data found.   Orientation:  Alert and oriented to person, place and not to date. No aphasia or dysarthria. Fund of knowledge is appropriate. Recent memory impaired and remote memory intact.  Attention and concentration are normal.  Able to name objects and repeat phrases. Delayed recall  0/5 Cranial nerves: There is good facial symmetry. Extraocular muscles are intact and visual fields are full to confrontational testing. Speech is fluent and clear. Soft palate rises symmetrically and there is no tongue deviation. Hearing is intact to conversational tone. Tone: Tone is good throughout. No cogwheeling Tremors:  RUE intention tremor, mild  Sensation: Sensation is intact to light touch and pinprick throughout. Vibration is  intact at the bilateral big toe.There is no extinction with double simultaneous stimulation. There is no sensory dermatomal level identified. Coordination: The patient has no difficulty with RAM's or FNF bilaterally. Normal finger to nose  Motor: Strength is 5/5 in the bilateral upper and lower extremities. There is no pronator drift. There are no fasciculations noted. DTR's: Deep tendon reflexes are 2/4 at the bilateral biceps, triceps, brachioradialis, patella and achilles.  Plantar responses are downgoing bilaterally. Gait and Station: The patient is able to ambulate without difficulty.The patient is able to heel toe walk without any difficulty.The patient is able to ambulate in a tandem fashion. The patient is able to stand in the Romberg position.     Thank you for allowing [place] the opportunity to participate in the care of this nice patient.  Please do not hesitate to contact us for any questions or concerns.   Total time spent on today's visit was 60 minutes, including both face-to-face time and nonface-to-face time.  Time included that spent on review of records (prior notes available to me/labs/imaging if pertinent), discussing treatment and goals, answering patient's questions and coordinating care.  Cc:  Madison Hickman, FNP  Sharene Butters 03/14/2021 11:55 AM

## 2021-03-14 NOTE — Patient Instructions (Signed)
It was a pleasure to see you today at our office.   Recommendations:  Neurocognitive evaluation at our office MRI of the brain, the radiology office will call you to arrange you appointment Check labs today Follow up  3 months and also after neurocognitive testing Continue Aricept 10 mg daily   RECOMMENDATIONS FOR ALL PATIENTS WITH MEMORY PROBLEMS: 1. Continue to exercise (Recommend 30 minutes of walking everyday, or 3 hours every week) 2. Increase social interactions - continue going to [place] and enjoy social gatherings with friends and family 3. Eat healthy, avoid fried foods and eat more fruits and vegetables 4. Maintain adequate blood pressure, blood sugar, and blood cholesterol level. Reducing the risk of stroke and cardiovascular disease also helps promoting better memory. 5. Avoid stressful situations. Live a simple life and avoid aggravations. Organize your time and prepare for the next day in anticipation. 6. Sleep well, avoid any interruptions of sleep and avoid any distractions in the bedroom that may interfere with adequate sleep quality 7. Avoid sugar, avoid sweets as there is a strong link between excessive sugar intake, diabetes, and cognitive impairment We discussed the Mediterranean diet, which has been shown to help patients reduce the risk of progressive memory disorders and reduces cardiovascular risk. This includes eating fish, eat fruits and green leafy vegetables, nuts like almonds and hazelnuts, walnuts, and also use olive oil. Avoid fast foods and fried foods as much as possible. Avoid sweets and sugar as sugar use has been linked to worsening of memory function.  There is always a concern of gradual progression of memory problems. If this is the case, then we may need to adjust level of care according to patient needs. Support, both to the patient and caregiver, should then be put into place.      You have been referred for a neuropsychological evaluation (i.e.,  evaluation of memory and thinking abilities). Please bring someone with you to this appointment if possible, as it is helpful for the doctor to hear from both you and another adult who knows you well. Please bring eyeglasses and hearing aids if you wear them.    The evaluation will take approximately 3 hours and has two parts:   The first part is a clinical interview with the neuropsychologist (Dr. Melvyn Novas or Dr. Nicole Kindred). During the interview, the neuropsychologist will speak with you and the individual you brought to the appointment.    The second part of the evaluation is testing with the doctor's technician Hinton Dyer or Maudie Mercury). During the testing, the technician will ask you to remember different types of material, solve problems, and answer some questionnaires. Your family member will not be present for this portion of the evaluation.   Please note: We must reserve several hours of the neuropsychologist's time and the psychometrician's time for your evaluation appointment. As such, there is a No-Show fee of $100. If you are unable to attend any of your appointments, please contact our office as soon as possible to reschedule.    FALL PRECAUTIONS: Be cautious when walking. Scan the area for obstacles that may increase the risk of trips and falls. When getting up in the mornings, sit up at the edge of the bed for a few minutes before getting out of bed. Consider elevating the bed at the head end to avoid drop of blood pressure when getting up. Walk always in a well-lit room (use night lights in the walls). Avoid area rugs or power cords from appliances in the middle of  the walkways. Use a walker or a cane if necessary and consider physical therapy for balance exercise. Get your eyesight checked regularly.  FINANCIAL OVERSIGHT: Supervision, especially oversight when making financial decisions or transactions is also recommended.  HOME SAFETY: Consider the safety of the kitchen when operating appliances like  stoves, microwave oven, and blender. Consider having supervision and share cooking responsibilities until no longer able to participate in those. Accidents with firearms and other hazards in the house should be identified and addressed as well.   ABILITY TO BE LEFT ALONE: If patient is unable to contact 911 operator, consider using LifeLine, or when the need is there, arrange for someone to stay with patients. Smoking is a fire hazard, consider supervision or cessation. Risk of wandering should be assessed by caregiver and if detected at any point, supervision and safe proof recommendations should be instituted.  MEDICATION SUPERVISION: Inability to self-administer medication needs to be constantly addressed. Implement a mechanism to ensure safe administration of the medications.   DRIVING: Regarding driving, in patients with progressive memory problems, driving will be impaired. We advise to have someone else do the driving if trouble finding directions or if minor accidents are reported. Independent driving assessment is available to determine safety of driving.   If you are interested in the driving assessment, you can contact the following:  [employer] in [place]  [place] [phone] or [phone]    Excel refers to food and lifestyle choices that are based on the traditions of countries located on the [employer]. This way of eating has been shown to help prevent certain conditions and improve outcomes for people who have chronic diseases, like kidney disease and heart disease. What are tips for following this plan? Lifestyle  Cook and eat meals together with your family, when possible. Drink enough fluid to keep your urine clear or pale yellow. Be physically active every day. This includes: Aerobic exercise like running  or swimming. Leisure activities like gardening, walking, or housework. Get 7-8 hours of sleep each night. If recommended by your health care provider, drink red wine in moderation. This means 1 glass a day for nonpregnant women and 2 glasses a day for men. A glass of wine equals 5 oz (150 mL). Reading food labels  Check the serving size of packaged foods. For foods such as rice and pasta, the serving size refers to the amount of cooked product, not dry. Check the total fat in packaged foods. Avoid foods that have saturated fat or trans fats. Check the ingredients list for added sugars, such as corn syrup. Shopping  At the grocery store, buy most of your food from the areas near the walls of the store. This includes: Fresh fruits and vegetables (produce). Grains, beans, nuts, and seeds. Some of these may be available in unpackaged forms or large amounts (in bulk). Fresh seafood. Poultry and eggs. Low-fat dairy products. Buy whole ingredients instead of prepackaged foods. Buy fresh fruits and vegetables in-season from local farmers markets. Buy frozen fruits and vegetables in resealable bags. If you do not have access to quality fresh seafood, buy precooked frozen shrimp or canned fish, such as tuna, salmon, or sardines. Buy small amounts of raw or cooked vegetables, salads, or olives from the deli or salad bar at your store. Stock your pantry so you always have certain foods on hand, such as olive oil, canned tuna, canned  tomatoes, rice, pasta, and beans. Cooking  Cook foods with extra-virgin olive oil instead of using butter or other vegetable oils. Have meat as a side dish, and have vegetables or grains as your main dish. This means having meat in small portions or adding small amounts of meat to foods like pasta or stew. Use beans or vegetables instead of meat in common dishes like chili or lasagna. Experiment with different cooking methods. Try roasting or broiling vegetables instead of  steaming or sauteing them. Add frozen vegetables to soups, stews, pasta, or rice. Add nuts or seeds for added healthy fat at each meal. You can add these to yogurt, salads, or vegetable dishes. Marinate fish or vegetables using olive oil, lemon juice, garlic, and fresh herbs. Meal planning  Plan to eat 1 vegetarian meal one day each week. Try to work up to 2 vegetarian meals, if possible. Eat seafood 2 or more times a week. Have healthy snacks readily available, such as: Vegetable sticks with hummus. Greek yogurt. Fruit and nut trail mix. Eat balanced meals throughout the week. This includes: Fruit: 2-3 servings a day Vegetables: 4-5 servings a day Low-fat dairy: 2 servings a day Fish, poultry, or lean meat: 1 serving a day Beans and legumes: 2 or more servings a week Nuts and seeds: 1-2 servings a day Whole grains: 6-8 servings a day Extra-virgin olive oil: 3-4 servings a day Limit red meat and sweets to only a few servings a month What are my food choices? Mediterranean diet Recommended Grains: Whole-grain pasta. Brown rice. Bulgar wheat. Polenta. Couscous. Whole-wheat bread. [place]. Vegetables: Artichokes. Beets. Broccoli. Cabbage. Carrots. Eggplant. Green beans. Chard. Kale. Spinach. Onions. Leeks. Peas. Squash. Tomatoes. Peppers. Radishes. Fruits: Apples. Apricots. Avocado. Berries. Bananas. Cherries. Dates. Figs. Grapes. Lemons. Melon. Oranges. Peaches. Plums. Pomegranate. Meats and other protein foods: Beans. Almonds. Sunflower seeds. Pine nuts. Peanuts. [place]. Salmon. Scallops. Shrimp. [place]. Tilapia. Clams. Oysters. Eggs. Dairy: Low-fat milk. Cheese. Greek yogurt. Beverages: Water. Red wine. Herbal tea. Fats and oils: Extra virgin olive oil. Avocado oil. Grape seed oil. Sweets and desserts: [place] yogurt with honey. Baked apples. Poached pears. Trail mix. Seasoning and other foods: Basil. Cilantro. Coriander. Cumin. Mint. Parsley. Sage. Rosemary. Tarragon. Garlic.  Oregano. Thyme. Pepper. Balsalmic vinegar. Tahini. Hummus. Tomato sauce. Olives. Mushrooms. Limit these Grains: Prepackaged pasta or rice dishes. Prepackaged cereal with added sugar. Vegetables: Deep fried potatoes (french fries). Fruits: Fruit canned in syrup. Meats and other protein foods: Beef. Pork. Lamb. Poultry with skin. Hot dogs. Berniece Salines. Dairy: Ice cream. Sour cream. Whole milk. Beverages: Juice. Sugar-sweetened soft drinks. Beer. Liquor and spirits. Fats and oils: Butter. Canola oil. Vegetable oil. Beef fat (tallow). Lard. Sweets and desserts: Cookies. Cakes. Pies. Candy. Seasoning and other foods: Mayonnaise. Premade sauces and marinades. The items listed may not be a complete list. Talk with your dietitian about what dietary choices are right for you. Summary The Mediterranean diet includes both food and lifestyle choices. Eat a variety of fresh fruits and vegetables, beans, nuts, seeds, and whole grains. Limit the amount of red meat and sweets that you eat. Talk with your health care provider about whether it is safe for you to drink red wine in moderation. This means 1 glass a day for nonpregnant women and 2 glasses a day for men. A glass of wine equals 5 oz (150 mL). This information is not intended to replace advice given to you by your health care provider. Make sure you discuss any questions you have with your  health care provider. Document Released: 09/23/2015 Document Revised: 10/26/2015 Document Reviewed: 09/23/2015 Elsevier Interactive Patient Education  2017 [employer].

## 2021-03-15 LAB — IRON AND TIBC
Iron Saturation: 31 % (ref 15–55)
Iron: 134 ug/dL (ref 38–169)
Total Iron Binding Capacity: 439 ug/dL (ref 250–450)
UIBC: 305 ug/dL (ref 111–343)

## 2021-03-16 ENCOUNTER — Other Ambulatory Visit: Payer: Self-pay

## 2021-03-16 ENCOUNTER — Telehealth: Payer: Self-pay

## 2021-03-16 MED ORDER — OMEPRAZOLE 40 MG PO CPDR: DELAYED_RELEASE_CAPSULE | ORAL | 11 refills | Status: DC

## 2021-03-16 NOTE — Telephone Encounter (Signed)
I received this patient message. What PPI would you want to change the patient to. Thanks   [name], [name]  [facility] Clinical Pool Phone Number: [phone]   After my procedure on Jan 5 I was talking to Dr Ardis Hughs at the hospital about the medication I take for bad acid reflux (Dexilant) and that I was having trouble with my insurance carrier [employer] (histocompatibility and immunogenetics)) this year in that they did not want to cover this medication any longer.  We have had our doctor request approval of this medication for me but it is not doing any good.  Dr Ardis Hughs said if I would call his office he would recommend some medication that would be just as good as Dexilant for me and not as expensive.  Is there a medication that Dr Ardis Hughs could prescribe for me to help my acid reflux?  My drug store is [facility] in [place].  Thank you for any help you can give me.  Charles Carr (08/16/1950)

## 2021-03-16 NOTE — Telephone Encounter (Signed)
Rx to [facility]. Called the patient to advise him of this. Thanks me for calling. Understands to let us know if he has further problems.

## 2021-03-27 ENCOUNTER — Ambulatory Visit
Admission: RE | Admit: 2021-03-27 | Discharge: 2021-03-27 | Disposition: A | Discharge status: Home / Self Care | Payer: Medicare HMO | Source: Ambulatory Visit | Attending: Physician Assistant | Admitting: Physician Assistant

## 2021-03-27 ENCOUNTER — Other Ambulatory Visit: Payer: Self-pay

## 2021-03-27 DIAGNOSIS — R413 Other amnesia: Secondary | ICD-10-CM

## 2021-03-27 IMAGING — MR MR HEAD W/O CM
10 series · 48 of 48 positions shown · non-contrast
Comparison: [HS]

CLINICAL DATA: Memory loss

EXAM:
MRI HEAD WITHOUT CONTRAST
TECHNIQUE: Multiplanar, multiecho pulse sequences of the brain and surrounding
structures were obtained without intravenous contrast.

[Series 2: T1 · sagittal · 5.0mm · 0.49mm/px · 3 of 23 slices shown]
[im 1/23]
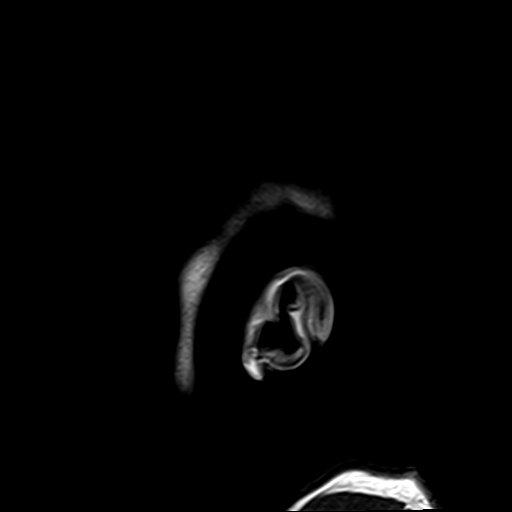
[im 12/23]
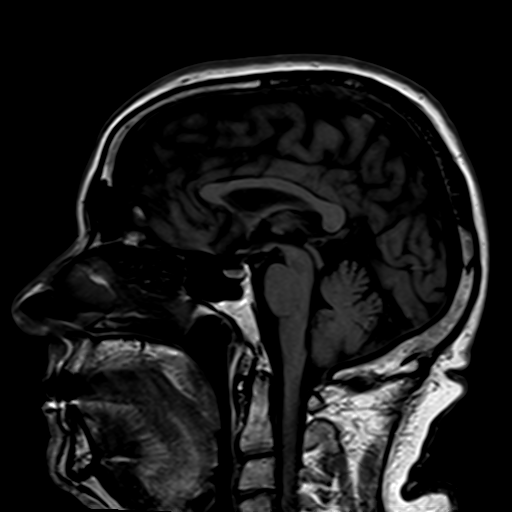
[im 23/23]
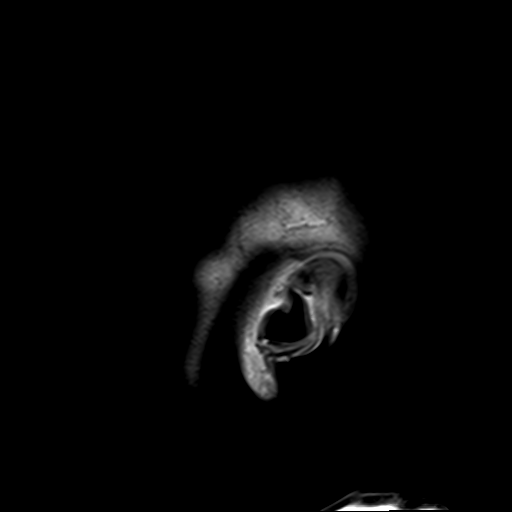

[Series 3: DWI · axial · 3.0mm · 1.88mm/px · z∈[-69,+86]mm · 9 of 107 slices shown (1 of 4)]
[im 1/107]
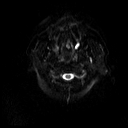
[im 14/107]
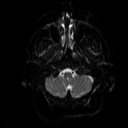
[im 27/107]
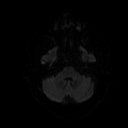
[im 40/107]
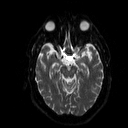
[im 54/107]
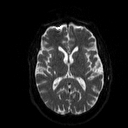
[im 67/107]
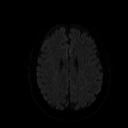
[im 80/107]
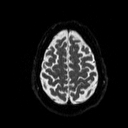
[im 93/107]
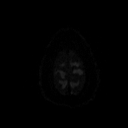
[im 107/107]
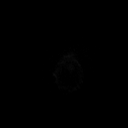

[Series 4: DWI · axial · 3.0mm · 1.88mm/px · z∈[-69,+86]mm · 4 of 54 slices shown (2 of 4)]
[im 1/54]
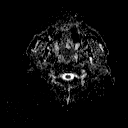
[im 18/54]
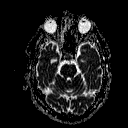
[im 36/54]
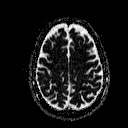
[im 54/54]
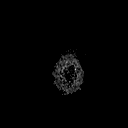

[Series 5: DWI · coronal · 5.0mm · 1.80mm/px · 6 of 76 slices shown (3 of 4)]
[im 1/76]
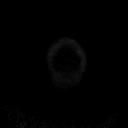
[im 16/76]
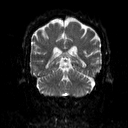
[im 31/76]
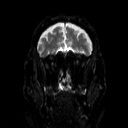
[im 46/76]
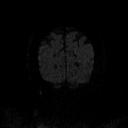
[im 61/76]
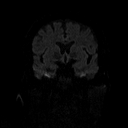
[im 76/76]
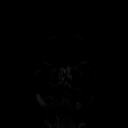

[Series 6: DWI · coronal · 5.0mm · 1.80mm/px · 3 of 38 slices shown (4 of 4)]
[im 1/38]
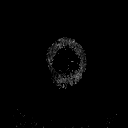
[im 19/38]
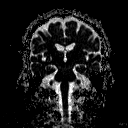
[im 38/38]
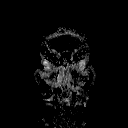

[Series 7: T2 · axial · 5.0mm · 0.62mm/px · z∈[-65,+86]mm · 2 of 23 slices shown (1 of 2)]
[im 1/23]
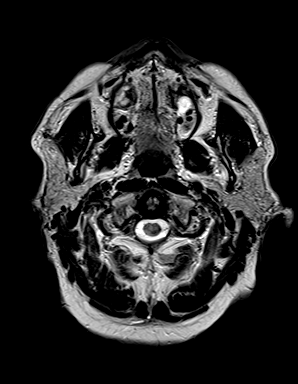
[im 23/23]
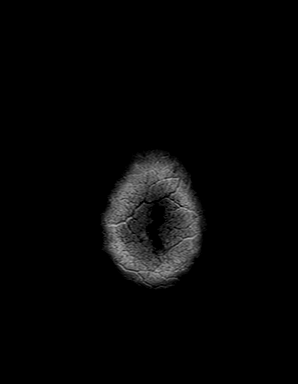

[Series 8: FLAIR · axial · 3.0mm · 0.47mm/px · z∈[-67,+83]mm · 3 of 34 slices shown]
[im 1/34]
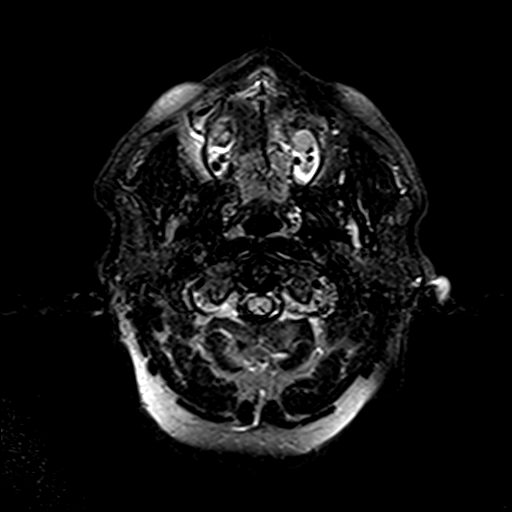
[im 17/34]
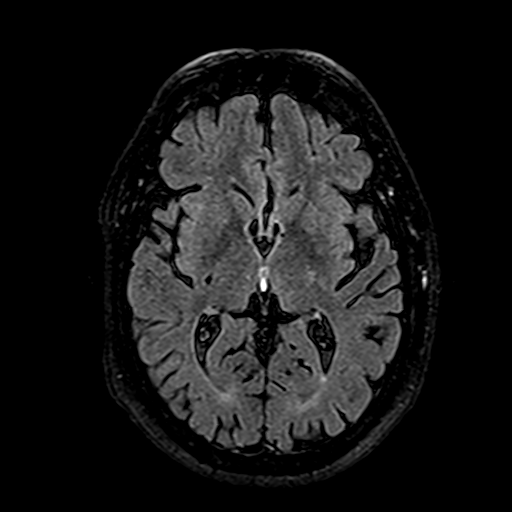
[im 34/34]
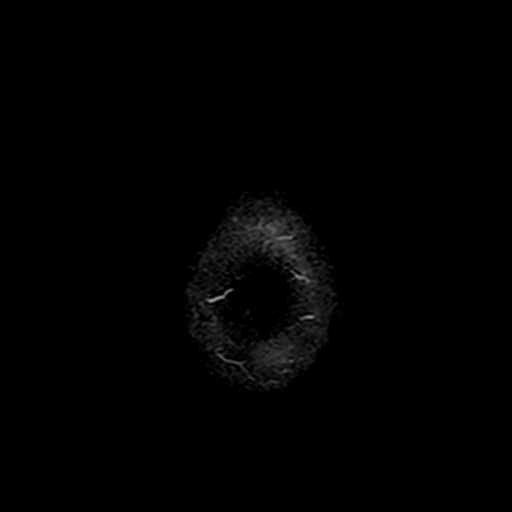

[Series 10: swi_images · axial · 4.0mm · 0.94mm/px · z∈[-68,+85]mm · 3 of 40 slices shown]
[im 1/40]
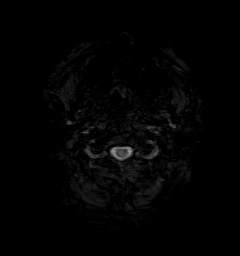
[im 20/40]
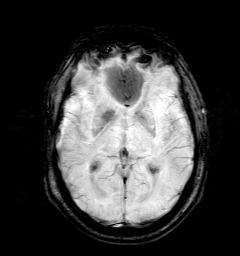
[im 40/40]
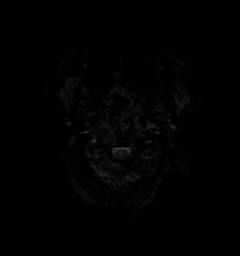

[Series 11: t1_mpr_tra · axial · 1.0mm · 0.75mm/px · z∈[-67,+89]mm · 13 of 160 slices shown]
[im 1/160]
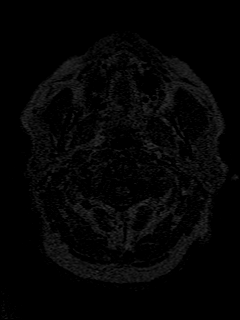
[im 14/160]
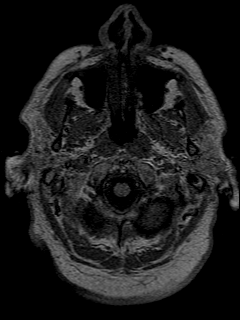
[im 27/160]
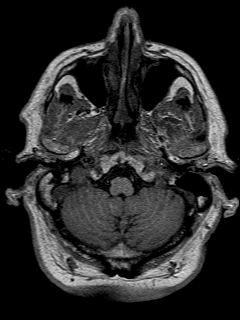
[im 40/160]
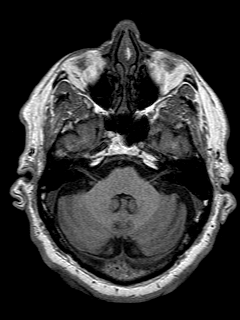
[im 54/160]
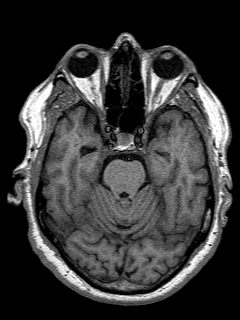
[im 67/160]
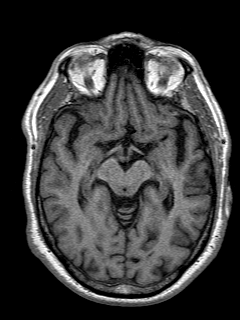
[im 80/160]
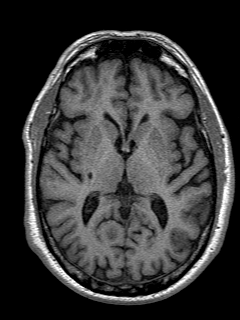
[im 93/160]
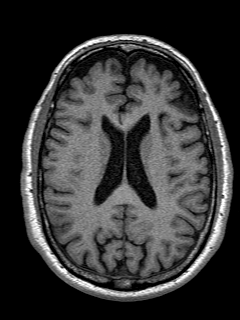
[im 107/160]
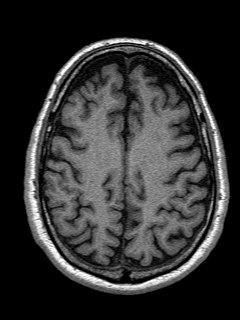
[im 120/160]
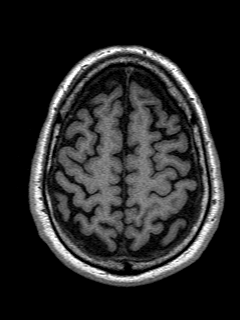
[im 133/160]
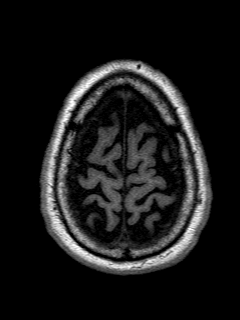
[im 146/160]
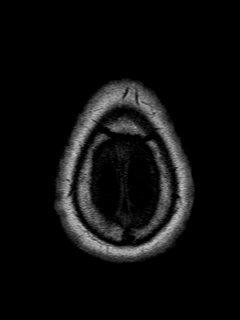
[im 160/160]
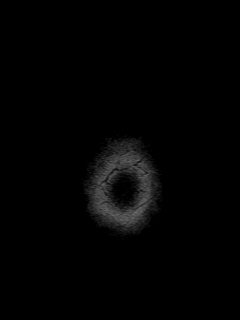

[Series 12: T2 · coronal · 5.0mm · 0.45mm/px · 2 of 29 slices shown (2 of 2)]
[im 1/29]
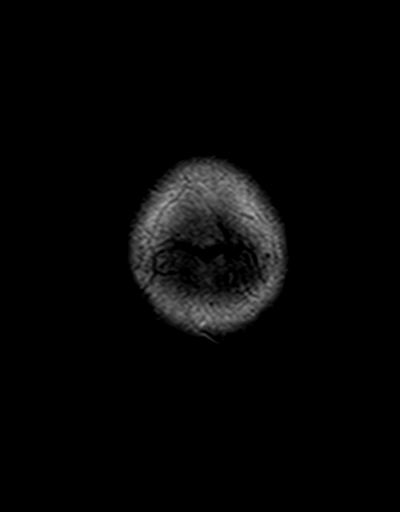
[im 29/29]
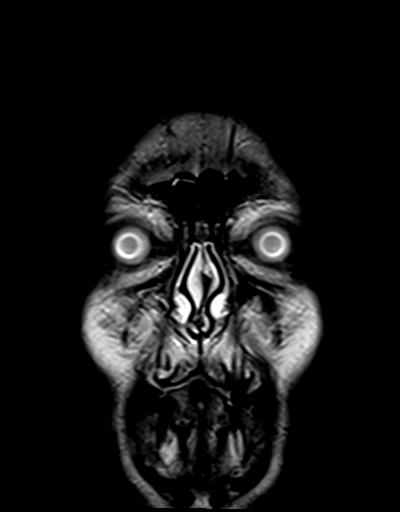

[48 of 48 positions shown; findings below may reference images not displayed]

FINDINGS: Brain: There is no acute infarction or intracranial hemorrhage.
There is no intracranial mass, mass effect, or edema. There is no
hydrocephalus or extra-axial fluid collection.

Prominence of the ventricles and sulci reflects mild parenchymal
volume loss similar to prior study. Unchanged minimal foci of T2
hyperintensity in the supratentorial white matter are nonspecific
and may reflect minor chronic microvascular ischemic changes or
other etiologies of gliosis/demyelination.

Vascular: Major vessel flow voids at the skull base are preserved.

Skull and upper cervical spine: Normal marrow signal is preserved.

Sinuses/Orbits: Paranasal sinuses are aerated. Orbits are
unremarkable.

Other: Sella is unremarkable.  Mastoid air cells are clear.
IMPRESSION: No acute abnormality or substantial change since [HS] examination.

## 2021-03-28 NOTE — Progress Notes (Signed)
Please inform patient that MRI of the brain is without significant changes since 2021. NO new findings. Thanks

## 2021-04-13 ENCOUNTER — Telehealth: Payer: Self-pay

## 2021-04-13 DIAGNOSIS — D500 Iron deficiency anemia secondary to blood loss (chronic): Secondary | ICD-10-CM

## 2021-04-13 NOTE — Telephone Encounter (Signed)
Patient notified that he will need to have lab work in March.  Advised that he does not require an appointment for lab work, but can come at his convenience and report to basement level for labs.  Patient agreed to plan and verbalized understanding.  No further questions.  ?

## 2021-04-13 NOTE — Telephone Encounter (Signed)
-----   Message from Badin sent at 02/22/2021  4:09 PM EST ----- ?Regarding: CBC ?Recheck CBC in 2 months (March 2023).  Patient requested to have reminder call in March for lab work.   ? ?

## 2021-06-14 ENCOUNTER — Encounter: Payer: Self-pay | Admitting: Physician Assistant

## 2021-06-14 ENCOUNTER — Ambulatory Visit: Payer: Medicare HMO | Admitting: Physician Assistant

## 2021-06-14 VITALS — BP 126/85 | HR 59 | Resp 20 | Ht 68.0 in | Wt 186.0 lb

## 2021-06-14 DIAGNOSIS — G3184 Mild cognitive impairment, so stated: Secondary | ICD-10-CM | POA: Diagnosis not present

## 2021-06-14 NOTE — Patient Instructions (Signed)
It was a pleasure to see you today at our office.  ? ?Recommendations: ? ?Follow up in  6 months ?Continue donepezil 10 mg daily. Side effects were discussed  ?Keep the neurocognitive testing ? ?RECOMMENDATIONS FOR ALL PATIENTS WITH MEMORY PROBLEMS: ?1. Continue to exercise (Recommend 30 minutes of walking everyday, or 3 hours every week) ?2. Increase social interactions - continue going to [place] and enjoy social gatherings with friends and family ?3. Eat healthy, avoid fried foods and eat more fruits and vegetables ?4. Maintain adequate blood pressure, blood sugar, and blood cholesterol level. Reducing the risk of stroke and cardiovascular disease also helps promoting better memory. ?5. Avoid stressful situations. Live a simple life and avoid aggravations. Organize your time and prepare for the next day in anticipation. ?6. Sleep well, avoid any interruptions of sleep and avoid any distractions in the bedroom that may interfere with adequate sleep quality ?7. Avoid sugar, avoid sweets as there is a strong link between excessive sugar intake, diabetes, and cognitive impairment ?We discussed the Mediterranean diet, which has been shown to help patients reduce the risk of progressive memory disorders and reduces cardiovascular risk. This includes eating fish, eat fruits and green leafy vegetables, nuts like almonds and hazelnuts, walnuts, and also use olive oil. Avoid fast foods and fried foods as much as possible. Avoid sweets and sugar as sugar use has been linked to worsening of memory function. ? ?There is always a concern of gradual progression of memory problems. If this is the case, then we may need to adjust level of care according to patient needs. Support, both to the patient and caregiver, should then be put into place.  ? ? ?FALL PRECAUTIONS: Be cautious when walking. Scan the area for obstacles that may increase the risk of trips and falls. When getting up in the mornings, sit up at the edge of the bed  for a few minutes before getting out of bed. Consider elevating the bed at the head end to avoid drop of blood pressure when getting up. Walk always in a well-lit room (use night lights in the walls). Avoid area rugs or power cords from appliances in the middle of the walkways. Use a walker or a cane if necessary and consider physical therapy for balance exercise. Get your eyesight checked regularly. ? ?FINANCIAL OVERSIGHT: Supervision, especially oversight when making financial decisions or transactions is also recommended. ? ?HOME SAFETY: Consider the safety of the kitchen when operating appliances like stoves, microwave oven, and blender. Consider having supervision and share cooking responsibilities until no longer able to participate in those. Accidents with firearms and other hazards in the house should be identified and addressed as well. ? ? ?ABILITY TO BE LEFT ALONE: If patient is unable to contact 911 operator, consider using Life[employer], or when the need is there, arrange for someone to stay with patients. Smoking is a fire hazard, consider supervision or cessation. Risk of wandering should be assessed by caregiver and if detected at any point, supervision and safe proof recommendations should be instituted. ? ?MEDICATION SUPERVISION: Inability to self-administer medication needs to be constantly addressed. Implement a mechanism to ensure safe administration of the medications. ? ? ?DRIVING: Regarding driving, in patients with progressive memory problems, driving will be impaired. We advise to have someone else do the driving if trouble finding directions or if minor accidents are reported. Independent driving assessment is available to determine safety of driving. ? ? ?If you are interested in the driving assessment, you can  contact the following: ? ?[facility] [employer] [facility] in [place] ? ?[place] [phone] ? ?[facility] [phone] ? ?[facility]  [phone] or [phone] ?  ?

## 2021-06-14 NOTE — Progress Notes (Signed)
Assessment/Plan:   Mild Cognitive Impairment due to Alzheimer's Disease  Charles Carr is a very pleasant 71 y.o. year old RH male with risk factors including GAD, history of Iron Deficiency Anemia and GAVE ( Gastric Antral Vascular Ectasia) followed up by GI, history of anemia and colon polyps, seen today for evaluation of memory loss. Patient is currently on donepezil initially at 5 mg since March until December 2022, then increased to 10 mg nightly by PCP as he noted progression of his cognitive deficiencies.  MoCA today is 28/30 . MRI brain without changes since 2021 imaging, again showing mild parenchymal volume loss, chronic microvascular ischemic changes, without acute abnormalities. Patient is doing well, exam is normal.    Recommendations:   Discussed safety both in and out of the home.  Keep neurocognitive testing  appointment Discussed the importance of regular daily schedule with inclusion of crossword puzzles to maintain brain function.  Continue to monitor mood by PCP Stay active at least 30 minutes at least 3 times a week.  Naps should be scheduled and should be no longer than 60 minutes and should not occur after 2 PM.  Mediterranean diet is recommended  Control cardiovascular risk factors  Continue  Donepezil 10 mg daily.  Side effects were discussed Follow up in 6 months.   Case discussed with Dr. Karel Jarvis who agrees with the plan     Subjective:    Charles Carr is a very pleasant 71 y.o. RH male  seen today in follow up for memory loss. This patient is accompanied in the office by his wife who supplements the history.  Previous records as well as any outside records available were reviewed prior to todays visit.  Patient was last seen at our office on 03/14/21  at which time his MoCA was 17/30 Patient is currently on Donepezil 10  mg daily tolerating well.   Any changes in memory since last visit? Not any worse, feel better, "especially as I am  replenishing iron".  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 Patient lives with: Spouse   repeats oneself? Denies  Disoriented when walking into a room?  Patient denies   Leaving objects in unusual places?  Patient denies   Ambulates  with difficulty?   Patient denies   Recent falls?  Patient denies   Any head injuries?  Patient denies   History of seizures?   Patient denies   Wandering behavior?  Patient denies   Patient drives?   Patient drives without difficulty, denies getting lost Any mood changes such irritability agitation?  Patient denies   Any history of depression?:  Patient denies   Hallucinations?  Patient denies   Paranoia?  Patient denies   Patient reports that he sleeps well without many vivid dreams, denies REM behavior or sleepwalking   History of sleep apnea?  Patient denies   Any hygiene concerns?  Patient denies   Independent of bathing and dressing?  Endorsed  Does the patient needs help with medications? Patient denies   Who is in charge of the finances?   wife is in charge  Any changes in appetite?  Patient denies   Patient have trouble swallowing? Patient denies   Does the patient cook?  Patient denies   Any kitchen accidents such as leaving the stove on? Patient denies   Any headaches?  Patient denies   The double vision? Patient denies   Any focal numbness or tingling?  Patient denies  Chronic back pain Patient denies   Unilateral weakness?  Patient denies   Any tremors? Endorsed. Mild intention tremors, known, denies any worsening, no other parkinsonian signs.  Any history of anosmia?  Patient denies   Any incontinence of urine?  Patient denies   Any bowel dysfunction?   Patient denies     Initial Visit 1/30/23The patient is seen in neurologic consultation at the request of Hal Morales, NP for the evaluation of memory.  The patient is accompanied by his wife who supplements the history. This is a 71 y.o. year old RH  male who has had memory issues for about 2  years, when he began noticing he could not remember what his wife had told him in the recent conversation.  He also began to forget his on passwords, symptoms that had been worsening over the last year.  He initially visited his PCP in August 2021 with these complaints, had an MRI of the brain which was showing mild volume changes, otherwise negative.  His PCP placed him on donepezil 5 mg daily in March 2022, but as his memory changes were more evident, as of December 2022 this was increased to 10 mg daily.  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  Despite cauterization, he continued to have problems with short-term memory.  He denies leaving objects in unusual places.  He continues to drive and denies getting lost.  He denies any mood changes or depression.  He does have a history of GAD, well controlled with antianxiety medications.  He is not very active, never exercised much but recently he got a dog, and he is walking on a daily basis.  He does not like to read, do crossword puzzles or word finding.  He sleeps well.  His wife reports that for the last 10 years, he has been acting out in his dreams, at times moving his hands, at one point it was worse, "is better now since the pill ".  He denies hallucinations or paranoia.  There are no hygiene concerns, he is independent of bathing and dressing.  He is in charge of the medications, and denies missing any doses.  He is also in charge of the bills, and denies missing any payments.  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  His appetite is not as good as it was before according to him.  He denies trouble swallowing.  He does not cook.  He ambulates without difficulty,  denies any falls or ever having a head injury.  He has intermittent, chronic frontal headaches, about twice a week, takes Tylenol with resolution of it.  No photophobia, nausea or vomiting with it.  He denies double vision, dizziness, focal numbness, tingling, unilateral weakness, or anosmia.  He had COVID in March 2022.  He has mild right intention tremors, present for several years.  No history of seizures.  He denies urine incontinence, retention, constipation or diarrhea.  He denies sleep apnea, alcohol or tobacco history.  Family history remarkable for mother and father with Alzheimer's disease.   MRI of the brain on 10/14/2019 was remarkable for no acute findings and mild age related volume loss. Single punctate T2 and  FLAIR bright focus within the left frontal subcortical white matter, not likely significant and less than often seen at this age.   Labs 01/18/2021 remarkable for hemoglobin 11.5, hematocrit 37.4, MCV is low at 76.8 suspicious for microcytic anemia PREVIOUS MEDICATIONS:   CURRENT MEDICATIONS:  Outpatient Encounter Medications as of 06/14/2021  Medication Sig   acetaminophen (TYLENOL) 500 MG tablet Take 1,000 mg by mouth every 6 (six) hours as needed (pain).   ALPRAZolam (XANAX) 0.25 MG tablet Take 0.25 mg by mouth in the morning.   donepezil (ARICEPT) 10 MG tablet Take 10 mg by mouth at bedtime.   ferrous sulfate 325 (65 FE) MG tablet Take 325 mg by mouth in the morning.   omeprazole (PRILOSEC) 40 MG capsule Take at least 30 minutes before breakfast on an empty stomach   rosuvastatin (CRESTOR) 5 MG tablet Take 5 mg by mouth in the morning.   No facility-administered encounter medications on file as of 06/14/2021.     Objective:     PHYSICAL EXAMINATION:    VITALS:   Vitals:   06/14/21 0849  BP: 126/85  Pulse: (!) 59  Resp: 20  SpO2: 97%  Weight: 186 lb (84.4 kg)  Height: 5\' 8"  (1.727 m)    GEN:  The patient appears stated age and is in NAD. HEENT:  Normocephalic,  atraumatic.   Neurological examination:  General: NAD, well-groomed, appears stated age. Orientation: The patient is alert. Oriented to person, place and date Cranial nerves: There is good facial symmetry.The speech is fluent and clear. No aphasia or dysarthria. Fund of knowledge is appropriate. Recent and remote memory are normal. Attention and concentration are normal.  Able to name objects and repeat phrases.  Hearing is intact to conversational tone.  Delayed recall 3/3. World :D--OW Sensation: Sensation is intact to light touch throughout Motor: Strength is at least antigravity x4. Tremors: RUE minor intention tremor. DTR's 2/4 in UE/LE      03/14/2021   10:00 AM  Montreal Cognitive Assessment   Visuospatial/ Executive (0/5) 2  Naming (0/3) 3  Attention: Read list of digits (0/2) 2  Attention: Read list of letters (0/1) 1  Attention: Serial 7 subtraction starting at 100 (0/3) 2  Language: Repeat phrase (0/2) 1  Language : Fluency (0/1) 0  Abstraction (0/2) 1  Delayed Recall (0/5) 0  Orientation (0/6) 4  Total 16  Adjusted Score (based on education) 17       06/14/2021    1:00 PM  MMSE - Mini Mental State Exam  Orientation to time 5  Orientation to Place 5  Registration 3  Attention/ Calculation 3  Recall 3  Language- name 2 objects 2  Language- repeat 1  Language- follow 3 step command 3  Language- read & follow direction 1  Write a sentence 1  Copy design 1  Total score 28       Movement examination: Tone: There is normal tone in the UE/LE. No cogwheeling  Abnormal movements:  minimal RUE intention tremor.  No myoclonus.  No asterixis.   Coordination:  There is no decremation with RAM's. Normal finger to nose  Gait and Station: The patient has no difficulty arising out of a deep-seated chair without the use of the hands. [name] stride length is good.  Gait is cautious and narrow.    Total time spent on today's visit was 30  minutes, including both  face-to-face time and nonface-to-face time. Time included that spent on review of records (prior  notes available to me/labs/imaging if pertinent), discussing treatment and goals, answering patient's questions and coordinating care.  Cc:  Jerrye Bushy, FNP Marlowe Kays, PA-C

## 2021-11-21 ENCOUNTER — Ambulatory Visit: Payer: Medicare HMO | Admitting: Psychology

## 2021-11-21 ENCOUNTER — Encounter: Payer: Self-pay | Admitting: Psychology

## 2021-11-21 DIAGNOSIS — G3184 Mild cognitive impairment, so stated: Secondary | ICD-10-CM | POA: Insufficient documentation

## 2021-11-21 DIAGNOSIS — K219 Gastro-esophageal reflux disease without esophagitis: Secondary | ICD-10-CM | POA: Insufficient documentation

## 2021-11-21 DIAGNOSIS — R4189 Other symptoms and signs involving cognitive functions and awareness: Secondary | ICD-10-CM

## 2021-11-21 HISTORY — DX: Mild cognitive impairment of uncertain or unknown etiology: G31.84

## 2021-11-21 NOTE — Progress Notes (Signed)
NEUROPSYCHOLOGICAL EVALUATION [place]. [facility] Department of Neurology  Date of Evaluation: November 21, 2021  Reason for Referral:   Jocob Dambach is a 71 y.o. right-handed Caucasian male referred by Sharene Butters, PA-C, to characterize his current cognitive functioning and assist with diagnostic clarity and treatment planning in the context of subjective cognitive decline.   Assessment and Plan:   Clinical Impression(s): Mr. Schwager' pattern of performance is suggestive of a fairly isolated impairment surrounding all aspects of verbal learning and memory. Broadly speaking, performances were appropriate relative to age-matched peers and premorbid intellectual estimations across all other assessed cognitive domains. This includes processing speed, attention/concentration, executive functioning, safety/judgment, receptive and expressive language, visuospatial abilities, and visual learning and memory. Mr. Shipes denied difficulties completing instrumental activities of daily living (ADLs) independently. As such, given evidence for cognitive dysfunction described above, he meets criteria for a Mild Neurocognitive Disorder ("mild cognitive impairment") at the present time.  The etiology for ongoing memory impairment is unclear at the present time. Across verbal memory measures, Mr. Kotowski showed minimal benefit from repeated exposure to information, had difficulty recalling previously learned information after brief delays, and performed poorly across yes/no recognition tasks. Retention rates across verbal memory tasks ranged from 0% to 50%. Taken together, this suggests some concern surrounding rapid forgetting and an evolving memory storage impairment, both of which can be concerning signs for Alzheimer's disease. However, if this illness is indeed present, it would appear to be in extremely early stages given that visual memory remained intact, as well as all other cognitive  domains. Time will need to pass before we can more definitively comment on concerns for Alzheimer's disease and the risk for progressive cognitive decline.   Cognitive and behavioral characteristics were not concerning for Lewy body disease or frontotemporal lobar degeneration. Despite some upper extremity tremors, these were action-induced rather than resting and present only during motorized activity, which is generally inconsistent with Parkinson's disease. His recent brain MRI did not suggest strong vascular ischemia warranting a primary vascular etiology. He did not report ongoing psychiatric distress, sleep dysfunction, headaches, chronic pain, or other medical ailments which would otherwise compromise cognitive functioning. Continued monitoring will be important moving forward.  Recommendations: A repeat neuropsychological evaluation in 18-24 months (or sooner if functional decline is noted) is recommended to assess the trajectory of future cognitive decline should it occur. This will also aid in future efforts towards improved diagnostic clarity.  Mr. Meinders has already been prescribed a medication aimed to address memory loss and concerns surrounding Alzheimer's disease (i.e., donepezil/Aricept). He is encouraged to continue taking this medication as prescribed. It is important to highlight that this medication has been shown to slow functional decline in some individuals. There is no current treatment which can stop or reverse cognitive decline when caused by a neurodegenerative illness.   Performance across neurocognitive testing is not a strong predictor of an individual's safety operating a motor vehicle. Should his family wish to pursue a formalized driving evaluation, they could reach out to the following agencies: [employer] in [place]: [phone] Driver Rehabilitative Services: [facility]: [place]: [phone] or  [phone]  Should there be further progression of current deficits over time, he is unlikely to regain any independent living skills lost. Therefore, it is recommended that he remain as involved as possible in all aspects of household chores, finances, and medication management, with supervision to ensure adequate performance. He will likely benefit from the establishment  and maintenance of a routine in order to maximize his functional abilities over time.  It will be important for Mr. Hofferber to have another person with him when in situations where he may need to process information, weigh the pros and cons of different options, and make decisions, in order to ensure that he fully understands and recalls all information to be considered.  Mr. Fauble is encouraged to attend to lifestyle factors for brain health (e.g., regular physical exercise, good nutrition habits, regular participation in cognitively-stimulating activities, and general stress management techniques), which are likely to have benefits for both emotional adjustment and cognition. Optimal control of vascular risk factors (including safe cardiovascular exercise and adherence to dietary recommendations) is encouraged. Continued participation in activities which provide mental stimulation and social interaction is also recommended.   Important information should be provided to Mr. Ricard Dillon in written format in all instances. This information should be placed in a highly frequented and easily visible location within his home to promote recall. External strategies such as written notes in a consistently used memory journal, visual and nonverbal auditory cues such as a calendar on the refrigerator or appointments with alarm, such as on a cell phone, can also help maximize recall.  Review of Records:   Mr. Gartrell was seen by [facility] Neurology Sharene Butters, PA-C) on 03/14/2021 for an evaluation of memory loss. At that time, memory concerns were noted  for the prior two years. Examples included not recalling what his wife had recently said to him, as well as greater trouble recalling passwords. His PCP placed him on donepezil in March 2022. Both he and his wife noted progressive cognitive decline over the years. ADLs were described as intact. Performance on a brief cognitive screening instrument (MOCA) was 17/30. Ultimately, Mr. Pepper was referred for a comprehensive neuropsychological evaluation to characterize his cognitive abilities and to assist with diagnostic clarity and treatment planning.   Brain MRI on 10/14/2019 revealed mild age-related volume loss without reported lobar predominance. Brain MRI on 03/28/2021 was said to be stable relative to his previous scan.   Past Medical History:  Diagnosis Date   GAVE (gastric antral vascular ectasia) 07/02/2017   GERD (gastroesophageal reflux disease)    History of colonic polyps 07/02/2017   Iron deficiency anemia 07/02/2017   Personal history of kidney stones 07/02/2017    Past Surgical History:  Procedure Laterality Date   APPENDECTOMY     COLONOSCOPY     ESOPHAGOGASTRODUODENOSCOPY (EGD) WITH PROPOFOL N/A 02/26/2014   Procedure: ESOPHAGOGASTRODUODENOSCOPY (EGD) WITH PROPOFOL;  Surgeon: Milus Banister, MD;  Location: [facility] ENDOSCOPY;  Service: Endoscopy;  Laterality: N/A;  APC   ESOPHAGOGASTRODUODENOSCOPY (EGD) WITH PROPOFOL N/A 04/16/2014   Procedure: ESOPHAGOGASTRODUODENOSCOPY (EGD) WITH PROPOFOL;  Surgeon: Milus Banister, MD;  Location: [facility];  Service: Endoscopy;  Laterality: N/A;   ESOPHAGOGASTRODUODENOSCOPY (EGD) WITH PROPOFOL N/A 07/26/2017   Procedure: ESOPHAGOGASTRODUODENOSCOPY (EGD) WITH PROPOFOL;  Surgeon: Milus Banister, MD;  Location: [facility];  Service: Endoscopy;  Laterality: N/A;   ESOPHAGOGASTRODUODENOSCOPY (EGD) WITH PROPOFOL N/A 02/17/2021   Procedure: ESOPHAGOGASTRODUODENOSCOPY (EGD) WITH PROPOFOL;  Surgeon: Milus Banister, MD;  Location: [facility];  Service:  Endoscopy;  Laterality: N/A;   ESOPHAGOGASTRODUODENOSCOPY ENDOSCOPY     [facility]   HOT HEMOSTASIS N/A 02/26/2014   Procedure: HOT HEMOSTASIS (ARGON PLASMA COAGULATION/BICAP);  Surgeon: Milus Banister, MD;  Location: [facility];  Service: Endoscopy;  Laterality: N/A;   HOT HEMOSTASIS N/A 07/26/2017   Procedure: HOT HEMOSTASIS (ARGON PLASMA  COAGULATION/BICAP);  Surgeon: Milus Banister, MD;  Location: [facility];  Service: Endoscopy;  Laterality: N/A;   HOT HEMOSTASIS N/A 02/17/2021   Procedure: HOT HEMOSTASIS (ARGON PLASMA COAGULATION/BICAP);  Surgeon: Milus Banister, MD;  Location: [facility];  Service: Endoscopy;  Laterality: N/A;   KIDNEY STONE SURGERY     x2   TONSILLECTOMY     UPPER GASTROINTESTINAL ENDOSCOPY      Current Outpatient Medications:    acetaminophen (TYLENOL) 500 MG tablet, Take 1,000 mg by mouth every 6 (six) hours as needed (pain)., Disp: , Rfl:    ALPRAZolam (XANAX) 0.25 MG tablet, Take 0.25 mg by mouth in the morning., Disp: , Rfl: 1   donepezil (ARICEPT) 10 MG tablet, Take 10 mg by mouth at bedtime., Disp: , Rfl:    ferrous sulfate 325 (65 FE) MG tablet, Take 325 mg by mouth in the morning., Disp: , Rfl:    omeprazole (PRILOSEC) 40 MG capsule, Take at least 30 minutes before breakfast on an empty stomach, Disp: 30 capsule, Rfl: 11   rosuvastatin (CRESTOR) 5 MG tablet, Take 5 mg by mouth in the morning., Disp: , Rfl:   Clinical Interview:   The following information was obtained during a clinical interview with Mr. Blackburn and his wife prior to cognitive testing.  Cognitive Symptoms: Decreased short-term memory: Endorsed. He described primary difficulties with orientation (e.g., not knowing the date or day of the week). However, this was at least partially attributed to his retirement. He also reported trouble recalling names. His wife was in agreement with this. Memory decline was said to be present for the past year or so and progressively worsened.  Medical suggests progressively worsening difficulties since 2021.  Decreased long-term memory: Denied. Decreased attention/concentration: Denied. Reduced processing speed: Denied. Difficulties with executive functions: Denied. They also denied any trouble with impulsivity or significant personality changes.  Difficulties with emotion regulation: Denied. Difficulties with receptive language: Denied. Difficulties with word finding: Denied. Decreased visuoperceptual ability: Denied.  Difficulties completing ADLs: Denied.  Additional Medical History: History of traumatic brain injury/concussion: Denied. History of stroke: Denied. History of seizure activity: Denied. History of known exposure to toxins: Denied. Symptoms of chronic pain: Denied. Experience of frequent headaches/migraines: Denied. He and his wife alluded to a history of prior, largely infrequent headache experiences. These were noted to have decreased in frequency over time.  Frequent instances of dizziness/vertigo: Denied.  Sensory changes: He wears glasses with benefit and reported a very longstanding history of anosmia. Other sensory changes/difficulties (e.g., hearing, taste) were denied.  Balance/coordination difficulties: Denied. He also denied any recent falls. Other motor difficulties: Endorsed. He and his wife acknowledged a history of mild tremors to the point where they sought out physician opinion regarding Parkinson's disease many years prior. No resting tremor was reported. He reported an occasional postural and action tremor, noting that symptoms generally are present when he does not take an anti-anxiety medication. These symptoms were not said to have progressively worsened over time.   Sleep History: Estimated hours obtained each night: 7-8 hours.  Difficulties falling asleep: Denied. Difficulties staying asleep: Denied. Feels rested and refreshed upon awakening: Endorsed.  History of snoring:  Endorsed. History of waking up gasping for air: Denied. Witnessed breath cessation while asleep: Denied.  History of vivid dreaming: Endorsed. Excessive movement while asleep: Endorsed. His wife reported a longstanding history of talking in his sleep and sometimes moving his arms to the extent she is hit while asleep. She did not report any  worsening in the frequency or severity of these symptoms over the years.  Instances of acting out his dreams: Denied outside what is described above.   Psychiatric/Behavioral Health History: Depression: He described his current mood as "good" but did acknowledge some apprehension surrounding progressive memory decline. He denied any prior mental health concerns or diagnoses. Current or remote suicidal ideation, intent, or plan.  Anxiety: Denied. Mania: Denied. Trauma History: Denied. Visual/auditory hallucinations: Denied. Delusional thoughts: Denied.  Tobacco: Denied. Alcohol: He denied current alcohol consumption as well as a history of problematic alcohol abuse or dependence.  Recreational drugs: Denied.  Family History: Problem Relation Age of Onset   Alzheimer's disease Mother    Dementia Mother    Dementia Father        unspecified type   Colon cancer Neg Hx    Esophageal cancer Neg Hx    Rectal cancer Neg Hx    Stomach cancer Neg Hx    This information was confirmed by Mr. Ricard Dillon.  Academic/Vocational History: Highest level of educational attainment: 12 years. He graduated from high school and described himself as an average (B/C) student in academic settings. No relative weaknesses were reported.  History of developmental delay: Denied. History of grade repetition: Denied. Enrollment in special education courses: Denied. History of LD/ADHD: Denied.  Employment: Retired. 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 Medical records suggest that his retirement this past March 2023 was at  least somewhat related to cognitive dysfunction as there were reports of him becoming overwhelmed by the utilized computer systems.   Evaluation Results:   Behavioral Observations: Mr. Styer was accompanied by his wife, arrived to his appointment on time, and was appropriately dressed and groomed. He appeared alert and oriented. Observed gait and station were within normal limits. Gross motor functioning appeared intact upon informal observation and no abnormal movements (e.g., tremors) were noted. When directly discussing tremors, he held up his hand and demonstrated a very subtle postural tremor. His affect was generally relaxed and positive, but did range appropriately given the subject being discussed during the clinical interview or the task at hand during testing procedures. Spontaneous speech was fluent and word finding difficulties were not observed during the clinical interview. Thought processes were coherent, organized, and normal in content. Insight into his cognitive difficulties appeared adequate.   During testing, sustained attention was appropriate. Task engagement was adequate and he persisted when challenged. Action tremors were observed when performing motorized tasks. Overall, Mr. Strutz was cooperative with the clinical interview and subsequent testing procedures.   Adequacy of Effort: The validity of neuropsychological testing is limited by the extent to which the individual being tested may be assumed to have exerted adequate effort during testing. Mr. Derusha expressed his intention to perform to the best of his abilities and exhibited adequate task engagement and persistence. Scores across stand-alone and embedded performance validity measures were within expectation. As such, the results of the current evaluation are believed to be a valid representation of Mr. Smalls' current cognitive functioning.  Test Results: Mr. Carlyon was fully oriented at the time of the current  evaluation.  Intellectual abilities based upon educational and vocational attainment were estimated to be in the average range. Premorbid abilities were estimated to be within the well below average range based upon a single-word reading test.   Processing speed was below average to average. Basic attention was average. More complex attention (e.g., working memory) was below average. Executive functioning  was largely below average to average. He did exhibit somewhat greater difficulty across a semantic fluency set shifting task. He also performed in the below average range across a task assessing safety and judgment. Points were lost largely due to the provision of overly vague responses. However, there was evidence for one unsafe response. When asked what he should do if he accidentally overdosed on a prescription medication, he responded "drink a lot of water" rather than call 911 or seek urgent medical attention.  Assessed receptive language abilities were average. Likewise, Mr. Bulger did not exhibit any difficulties comprehending task instructions and answered all questions asked of him appropriately. Assessed expressive language (e.g., verbal fluency and confrontation naming) was below average.     Assessed visuospatial/visuoconstructional abilities were average to well above average.    Learning (i.e., encoding) of novel verbal and visual information was variable, ranging from the well below average to average normative ranges. Spontaneous delayed recall (i.e., retrieval) of previously learned information was also variable, ranging from the well below average to average normative ranges. Retention rates were 45% across a story learning task, 0% across a list learning task, 50% across a daily living task, and 71% across a shape learning task. Performance across recognition tasks was exceptionally low to below average, suggesting limited evidence for information consolidation.   Results of emotional  screening instruments suggested that recent symptoms of generalized anxiety were in the minimal range, while symptoms of depression were within normal limits. A screening instrument assessing recent sleep quality suggested the presence of minimal sleep dysfunction.  Tables of Scores:   Note: This summary of test scores accompanies the interpretive report and should not be considered in isolation without reference to the appropriate sections in the text. Descriptors are based on appropriate normative data and may be adjusted based on clinical judgment. Terms such as "Within Normal Limits" and "Outside Normal Limits" are used when a more specific description of the test score cannot be determined.       Percentile - Normative Descriptor > 98 - Exceptionally High 91-97 - Well Above Average 75-90 - Above Average 25-74 - Average 9-24 - Below Average 2-8 - Well Below Average < 2 - Exceptionally Low       Orientation:      Raw Score Percentile   NAB Orientation, Form 1 29/29 --- ---       Cognitive Screening:      Raw Score Percentile   SLUMS: 21/30 --- ---       Intellectual Functioning:      Standard Score Percentile   Barona Formula Estimated Premorbid IQ 104 61 Average        Standard Score Percentile   Test of Premorbid Functioning: 88 7 Well Below Average       Memory:     NAB Memory Module, Form 1: Standard Score/ T Score Percentile   Total Memory Index 75 5 Well Below Average  List Learning       Total Trials 1-3 12/36 (36) 8 Well Below Average    List B 1/12 (33) 5 Well Below Average    Short Delay Free Recall 0/12 (25) 1 Exceptionally Low    Long Delay Free Recall 0/12 (32) 4 Well Below Average    Retention Percentage 0 (22) <1 Exceptionally Low    Recognition Discriminability -3 (21) <1 Exceptionally Low  Shape Learning       Total Trials 1-3 15/27 (54) 66 Average    Delayed Recall 5/9 (51)  54 Average    Retention Percentage 71 (42) 21 Below Average    Recognition  Discriminability 5 (42) 21 Below Average  Story Learning       Immediate Recall 34/80 (34) 5 Well Below Average    Delayed Recall 9/40 (35) 7 Well Below Average    Retention Percentage 45 (31) 3 Well Below Average  Daily Living Memory       Immediate Recall 30/51 (39) 14 Below Average    Delayed Recall 6/17 (31) 3 Well Below Average    Retention Percentage 50 (28) 2 Well Below Average    Recognition Hits 6/10 (28) 2 Well Below Average       Attention/Executive Function:     Trail Making Test (TMT): Raw Score (T Score) Percentile     Part A 52 secs.,  0 errors (41) 18 Below Average    Part B 189 secs.,  3 errors (38) 12 Below Average         Scaled Score Percentile   WAIS-IV Coding: 6 9 Below Average       NAB Attention Module, Form 1: T Score Percentile     Digits Forward 50 50 Average    Digits Backwards 39 14 Below Average        Scaled Score Percentile   WAIS-IV Similarities: 8 25 Average       D-KEFS Color-Word Interference Test: Raw Score (Scaled Score) Percentile     Color Naming 38 secs. (8) 25 Average    Word Reading 25 secs. (10) 50 Average    Inhibition 80 secs. (9) 37 Average      Total Errors 0 errors (13) 84 Above Average    Inhibition/Switching 94 secs. (8) 25 Average      Total Errors 4 errors (9) 37 Average       D-KEFS Verbal Fluency Test: Raw Score (Scaled Score) Percentile     Letter Total Correct 23 (6) 9 Below Average    Category Total Correct 26 (7) 16 Below Average    Category Switching Total Correct 8 (5) 5 Well Below Average    Category Switching Accuracy 7 (6) 9 Below Average      Total Set Loss Errors 2 (10) 50 Average      Total Repetition Errors 10 (3) 1 Exceptionally Low       NAB Executive Functions Module, Form 1: T Score Percentile     Judgment 41 18 Below Average       Language:     Verbal Fluency Test: Raw Score (T Score) Percentile     Phonemic Fluency (FAS) 23 (38) 12 Below Average    Animal Fluency 14 (42) 21 Below Average         NAB Language Module, Form 1: T Score Percentile     Auditory Comprehension 44 27 Average    Naming 27/31 (37) 9 Below Average       Visuospatial/Visuoconstruction:      Raw Score Percentile   Clock Drawing: 9/10 --- Within Normal Limits       NAB Spatial Module, Form 1: T Score Percentile     Figure Drawing Copy 64 92 Well Above Average        Scaled Score Percentile   WAIS-IV Block Design: 11 63 Average       Mood and Personality:      Raw Score Percentile   Geriatric Depression Scale: 8 --- Within Normal Limits  Geriatric Anxiety Scale: 3 --- Minimal  Somatic 0 --- Minimal    Cognitive 1 --- Minimal    Affective 2 --- Minimal       Additional Questionnaires:      Raw Score Percentile   PROMIS Sleep Disturbance Questionnaire: 14 --- None to Slight   Informed Consent and Coding/Compliance:   The current evaluation represents a clinical evaluation for the purposes previously outlined by the referral source and is in no way reflective of a forensic evaluation.   Mr. Digilio was provided with a verbal description of the nature and purpose of the present neuropsychological evaluation. Also reviewed were the foreseeable risks and/or discomforts and benefits of the procedure, limits of confidentiality, and mandatory reporting requirements of this provider. The patient was given the opportunity to ask questions and receive answers about the evaluation. Oral consent to participate was provided by the patient.   This evaluation was conducted by Christia Reading, Ph.D., ABPP-CN, board certified clinical neuropsychologist. Mr. Dill completed a clinical interview with Dr. Melvyn Novas, billed as one unit [phone], and 160 minutes of cognitive testing and scoring, billed as one unit [phone] and four additional units [id number]. Psychometrist Milana Kidney, B.S., assisted Dr. Melvyn Novas with test administration and scoring procedures. As a separate and discrete service, Dr. Melvyn Novas spent a total of 160 minutes in  interpretation and report writing billed as one unit [phone] and two units [phone].

## 2021-11-21 NOTE — Progress Notes (Signed)
   Psychometrician Note   Cognitive testing was administered to Charles Carr by Milana Kidney, B.S. (psychometrist) under the supervision of Dr. Christia Reading, Ph.D., licensed psychologist on 11/21/2021. Charles Carr did not appear overtly distressed by the testing session per behavioral observation or responses across self-report questionnaires. Rest breaks were offered.    The battery of tests administered was selected by Dr. Christia Reading, Ph.D. with consideration to Charles Carr's current level of functioning, the nature of his symptoms, emotional and behavioral responses during interview, level of literacy, observed level of motivation/effort, and the nature of the referral question. This battery was communicated to the psychometrist. Communication between Dr. Christia Reading, Ph.D. and the psychometrist was ongoing throughout the evaluation and Dr. Christia Reading, Ph.D. was immediately accessible at all times. Dr. Christia Reading, Ph.D. provided supervision to the psychometrist on the date of this service to the extent necessary to assure the quality of all services provided.    Charles Carr will return within approximately 1-2 weeks for an interactive feedback session with Dr. Melvyn Novas at which time his test performances, clinical impressions, and treatment recommendations will be reviewed in detail. Charles Carr understands he can contact our office should he require our assistance before this time.  A total of 160 minutes of billable time were spent face-to-face with Charles Carr by the psychometrist. This includes both test administration and scoring time. Billing for these services is reflected in the clinical report generated by Dr. Christia Reading, Ph.D.  This note reflects time spent with the psychometrician and does not include test scores or any clinical interpretations made by Dr. Melvyn Novas. The full report will follow in a separate note.

## 2021-11-28 ENCOUNTER — Ambulatory Visit: Payer: Medicare HMO | Admitting: Psychology

## 2021-11-28 DIAGNOSIS — G3184 Mild cognitive impairment, so stated: Secondary | ICD-10-CM

## 2021-11-28 NOTE — Progress Notes (Signed)
   Neuropsychology Feedback Session Tillie Rung. [place] [facility]  Reason for Referral:   Roby Donaway is a 71 y.o. right-handed Caucasian male referred by Sharene Butters, PA-C, to characterize his current cognitive functioning and assist with diagnostic clarity and treatment planning in the context of subjective cognitive decline.   Feedback:   Mr. Bralley completed a comprehensive neuropsychological evaluation on 11/21/2021. Please refer to that encounter for the full report and recommendations. Briefly, results suggested a fairly isolated impairment surrounding all aspects of verbal learning and memory. Broadly speaking, performances were appropriate relative to age-matched peers and premorbid intellectual estimations across all other assessed cognitive domains. The etiology for ongoing memory impairment is unclear at the present time. Across verbal memory measures, Mr. Heft showed minimal benefit from repeated exposure to information, had difficulty recalling previously learned information after brief delays, and performed poorly across yes/no recognition tasks. Retention rates across verbal memory tasks ranged from 0% to 50%. Taken together, this suggests some concern surrounding rapid forgetting and an evolving memory storage impairment, both of which can be concerning signs for Alzheimer's disease. However, if this illness is indeed present, it would appear to be in extremely early stages given that visual memory remained intact, as well as all other cognitive domains. Time will need to pass before we can more definitively comment on concerns for Alzheimer's disease and the risk for progressive cognitive decline.   Mr. Leeth was accompanied by his wife during the current feedback session. Content of the current session focused on the results of his neuropsychological evaluation. Mr. Terpstra was given the opportunity to ask questions and his questions were answered. He  was encouraged to reach out should additional questions arise. A copy of his report was provided at the conclusion of the visit.      20 minutes were spent conducting the current feedback session with Mr. Zuver, billed as one unit [phone].

## 2021-12-12 ENCOUNTER — Other Ambulatory Visit: Payer: Self-pay | Admitting: Gastroenterology

## 2021-12-12 NOTE — Telephone Encounter (Signed)
I made a mistake Dr Cigiliano is DOD am.   Dr C,   Please advise on refills.   Thank you. 

## 2021-12-12 NOTE — Telephone Encounter (Signed)
Please send to correct DOD.

## 2021-12-12 NOTE — Telephone Encounter (Signed)
Good morning Dr Stark,  This is a patient of Dr Jacobs.  I am sending you the refill request as you are DOD am.  Please advise. 

## 2021-12-16 ENCOUNTER — Ambulatory Visit: Payer: Medicare HMO | Admitting: Physician Assistant

## 2021-12-16 ENCOUNTER — Encounter: Payer: Self-pay | Admitting: Physician Assistant

## 2021-12-16 VITALS — BP 169/74 | HR 56 | Resp 18 | Ht 68.0 in | Wt 178.0 lb

## 2021-12-16 DIAGNOSIS — G3184 Mild cognitive impairment, so stated: Secondary | ICD-10-CM | POA: Diagnosis not present

## 2021-12-16 NOTE — Progress Notes (Signed)
Assessment/Plan:   Mild Cognitive Impairment, likely due to Alzheimer's disease  Charles Carr is a very pleasant 71 y.o. RH male with a history of  GAD, history of Iron Deficiency Anemia and GAVE ( Gastric Antral Vascular Ectasia) followed up by GI, history of anemia and colon polyps and a recent diagnosis of mild cognitive impairment likely due to early stages Alzheimer's disease as per neuropsychological evaluation on presenting today in follow-up for evaluation of memory loss. Last MoCA was 17/30. He is stable from the cognitive standpoint.    Recommendations:   Follow up in 6 months. Repeat neuropsychological evaluation in 18 to 24 months for clarity of the diagnosis and disease progression. Continue to control mood as per PCP. Consider psychotherapy for anxiety Recommend good control of cardiovascular risk factors.   Continue donepezil 10 mg daily, side effects discussed      Subjective:   This patient is accompanied in the office by his wife who supplements the history. Previous records as well as any outside records available were reviewed prior to todays visit.  He was last seen on 06/14/2021.  Last MMSE 06/14/2021 was 28/30     Any changes in memory since last visit?  Denies any significant changes. His main concerns are difficulty remembering the dates or recalling names of people that he meets.  He continues to do dog walking since retirement in March of this year and reports it is "very therapeutic". repeats oneself?  Endorsed Disoriented when walking into a room?  Patient denies   Leaving objects in unusual places?  Patient denies   Ambulates  with difficulty?   Patient denies   Recent falls?  Patient denies   Any head injuries?  Patient denies   History of seizures?   Patient denies   Wandering behavior?  Patient denies   Patient drives?  No issues  Any mood changes since last visit?  Patient denies   Any worsening depression?:  Patient denies   Hallucinations?   Patient denies   Paranoia?  Patient denies   Patient reports that sleeps well without vivid dreams, REM behavior or sleepwalking   History of sleep apnea?  Patient denies   Any hygiene concerns?  Patient denies   Independent of bathing and dressing?  Endorsed  Does the patient needs help with medications?  In charge  Who is in charge of the finances? Wife  is in charge    Any changes in appetite?  Patient denies, does not drink enough water  Patient have trouble swallowing? Patient denies   Does the patient cook?  Patient denies   Any kitchen accidents such as leaving the stove on? Patient denies   Any headaches?  Patient denies   Double vision? Patient denies   Any focal numbness or tingling?  Patient denies   Chronic back pain Patient denies   Unilateral weakness?  Patient denies   Any tremors?  He has mild intention tremors, known, but he denies any worsening or other parkinsonian signs. Any history of anosmia?  "Never did " Any incontinence of urine?  Patient denies   Any bowel dysfunction?   Patient denies      Patient lives  wife, moving to a smaller house, to be near his children in [place]  Neuropsych evaluation, Dr. Melvyn Novas 11/10/21 Briefly, results suggested a fairly isolated impairment surrounding all aspects of verbal learning and memory. Broadly speaking, performances were appropriate relative to age-matched peers and premorbid intellectual estimations across all other assessed cognitive domains.  The etiology for ongoing memory impairment is unclear at the present time. Across verbal memory measures, Charles Carr showed minimal benefit from repeated exposure to information, had difficulty recalling previously learned information after brief delays, and performed poorly across yes/no recognition tasks. Retention rates across verbal memory tasks ranged from 0% to 50%. Taken together, this suggests some concern surrounding rapid forgetting and an evolving memory storage impairment, both of  which can be concerning signs for Alzheimer's disease. However, if this illness is indeed present, it would appear to be in extremely early stages given that visual memory remained intact, as well as all other cognitive domains. Time will need to pass before we can more definitively comment on concerns for Alzheimer's disease and the risk for progressive cognitive decline.      Initial Visit 1/30/23The patient is seen in neurologic consultation at the request of Charles Court, NP for the evaluation of memory.  The patient is accompanied by his wife who supplements the history. This is a 71 y.o. year old RH  male who has had memory issues for about 2 years, when he began noticing he could not remember what his wife had told him in the recent conversation.  He also began to forget his on passwords, symptoms that had been worsening over the last year.  He initially visited his PCP in August 2021 with these complaints, had an MRI of the brain which was showing mild volume changes, otherwise negative.  His PCP placed him on donepezil 5 mg daily in March 2022, but as his memory changes were more evident, as of December 2022 this was increased to 10 mg daily.  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  Despite cauterization, he continued to have problems with short-term memory.  He denies leaving objects in unusual places.  He continues to drive and denies getting lost.  He denies any mood changes or depression.  He does have a history of GAD, well controlled with antianxiety medications.  He is not very active, never exercised much but recently he got a dog, and he is walking on a daily basis.  He does not like to read, do crossword puzzles or word finding.  He sleeps well.  His wife reports that for the last 10 years, he has been acting out in his dreams, at times moving his hands, at  one point it was worse, "is better now since the pill ".  He denies hallucinations or paranoia.  There are no hygiene concerns, he is independent of bathing and dressing.  He is in charge of the medications, and denies missing any doses.  He is also in charge of the bills, and denies missing any payments.  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  His appetite is not as good as it was before according to him.  He denies trouble swallowing.  He does not cook.  He ambulates without difficulty, denies any falls or ever having a head injury.  He has intermittent, chronic frontal headaches, about twice a week, takes Tylenol with resolution of it.  No photophobia, nausea or vomiting with it.  He denies double vision, dizziness, focal numbness, tingling, unilateral weakness, or  anosmia.  He had COVID in March 2022.  He has mild right intention tremors, present for several years.  No history of seizures.  He denies urine incontinence, retention, constipation or diarrhea.  He denies sleep apnea, alcohol or tobacco history.  Family history remarkable for mother and father with Alzheimer's disease.   MRI of the brain on 10/14/2019 was remarkable for no acute findings and mild age related volume loss. Single punctate T2 and FLAIR bright focus within the left frontal subcortical white matter, not likely significant and less than often seen at this age.   Labs 01/18/2021 remarkable for hemoglobin 11.5, hematocrit 37.4, MCV is low at 76.8 suspicious for microcytic anemia   Past Medical History:  Diagnosis Date   GAVE (gastric antral vascular ectasia) 07/02/2017   GERD (gastroesophageal reflux disease)    History of colonic polyps 07/02/2017   Iron deficiency anemia 07/02/2017   Mild cognitive impairment with memory loss 11/21/2021   Personal history of kidney  stones 07/02/2017     Past Surgical History:  Procedure Laterality Date   APPENDECTOMY     COLONOSCOPY     ESOPHAGOGASTRODUODENOSCOPY (EGD) WITH PROPOFOL N/A 02/26/2014   Procedure: ESOPHAGOGASTRODUODENOSCOPY (EGD) WITH PROPOFOL;  Surgeon: Milus Banister, MD;  Location: [facility];  Service: Endoscopy;  Laterality: N/A;  APC   ESOPHAGOGASTRODUODENOSCOPY (EGD) WITH PROPOFOL N/A 04/16/2014   Procedure: ESOPHAGOGASTRODUODENOSCOPY (EGD) WITH PROPOFOL;  Surgeon: Milus Banister, MD;  Location: [facility];  Service: Endoscopy;  Laterality: N/A;   ESOPHAGOGASTRODUODENOSCOPY (EGD) WITH PROPOFOL N/A 07/26/2017   Procedure: ESOPHAGOGASTRODUODENOSCOPY (EGD) WITH PROPOFOL;  Surgeon: Milus Banister, MD;  Location: [facility];  Service: Endoscopy;  Laterality: N/A;   ESOPHAGOGASTRODUODENOSCOPY (EGD) WITH PROPOFOL N/A 02/17/2021   Procedure: ESOPHAGOGASTRODUODENOSCOPY (EGD) WITH PROPOFOL;  Surgeon: Milus Banister, MD;  Location: [facility];  Service: Endoscopy;  Laterality: N/A;   ESOPHAGOGASTRODUODENOSCOPY ENDOSCOPY     [facility]   HOT HEMOSTASIS N/A 02/26/2014   Procedure: HOT HEMOSTASIS (ARGON PLASMA COAGULATION/BICAP);  Surgeon: Milus Banister, MD;  Location: [facility];  Service: Endoscopy;  Laterality: N/A;   HOT HEMOSTASIS N/A 07/26/2017   Procedure: HOT HEMOSTASIS (ARGON PLASMA COAGULATION/BICAP);  Surgeon: Milus Banister, MD;  Location: [facility];  Service: Endoscopy;  Laterality: N/A;   HOT HEMOSTASIS N/A 02/17/2021   Procedure: HOT HEMOSTASIS (ARGON PLASMA COAGULATION/BICAP);  Surgeon: Milus Banister, MD;  Location: [facility];  Service: Endoscopy;  Laterality: N/A;   KIDNEY STONE SURGERY     x2   TONSILLECTOMY     UPPER GASTROINTESTINAL ENDOSCOPY       PREVIOUS MEDICATIONS:   CURRENT MEDICATIONS:  Outpatient Encounter Medications as of 12/16/2021  Medication Sig   acetaminophen (TYLENOL) 500 MG tablet Take 1,000 mg by mouth every 6 (six) hours as needed (pain).    ALPRAZolam (XANAX) 0.25 MG tablet Take 0.25 mg by mouth in the morning.   donepezil (ARICEPT) 10 MG tablet Take 10 mg by mouth at bedtime.   ferrous sulfate 325 (65 FE) MG tablet Take 325 mg by mouth in the morning.   omeprazole (PRILOSEC) 40 MG capsule Take at least 30 minutes before breakfast on an empty stomach   rosuvastatin (CRESTOR) 5 MG tablet Take 5 mg by mouth in the morning.   No facility-administered encounter medications on file as of 12/16/2021.     Objective:     PHYSICAL EXAMINATION:    VITALS:   Vitals:   12/16/21 0925  BP: (!) 169/74  Pulse: (!) 56  Resp: 18  SpO2: 96%  Weight: 178 lb (80.7 kg)  Height: '5\' 8"'$  (1.727 m)    GEN:  The patient appears stated age and is in NAD. HEENT:  Normocephalic, atraumatic.   Neurological examination:  General: NAD, well-groomed, appears stated age. Orientation: The patient is alert. Oriented to person, place and date Cranial nerves: There is good facial symmetry.The speech is fluent and clear. No aphasia or dysarthria. Fund of knowledge is appropriate. Recent memory impaired and remote memory is normal.  Attention and concentration are normal.  Able to name objects and repeat phrases.  Hearing is intact to conversational tone.    Sensation: Sensation is intact to light touch throughout Motor: Strength is at least antigravity x4. Tremors: Minimal right upper extremity intention tremor.  No cogwheeling DTR's 2/4 in UE/LE      03/14/2021   10:00 AM  Montreal Cognitive Assessment   Visuospatial/ Executive (0/5) 2  Naming (0/3) 3  Attention: Read list of digits (0/2) 2  Attention: Read list of letters (0/1) 1  Attention: Serial 7 subtraction starting at 100 (0/3) 2  Language: Repeat phrase (0/2) 1  Language : Fluency (0/1) 0  Abstraction (0/2) 1  Delayed Recall (0/5) 0  Orientation (0/6) 4  Total 16  Adjusted Score (based on education) 17       06/14/2021    1:00 PM  MMSE - Mini Mental State Exam  Orientation to  time 5  Orientation to Place 5  Registration 3  Attention/ Calculation 3  Recall 3  Language- name 2 objects 2  Language- repeat 1  Language- follow 3 step command 3  Language- read & follow direction 1  Write a sentence 1  Copy design 1  Total score 28       Movement examination: Tone: There is normal tone in the UE/LE Abnormal movements:    No myoclonus.  No asterixis.   Coordination:  There is no decremation with RAM's. Normal finger to nose  Gait and Station: The patient has no difficulty arising out of a deep-seated chair without the use of the hands. [name] stride length is good.  Gait is cautious and narrow.   Thank you for allowing [place] the opportunity to participate in the care of this nice patient. Please do not hesitate to contact us for any questions or concerns.   Total time spent on today's visit was 33 minutes dedicated to this patient today, preparing to see patient, examining the patient, ordering tests and/or medications and counseling the patient, documenting clinical information in the EHR or other health record, independently interpreting results and communicating results to the patient/family, discussing treatment and goals, answering patient's questions and coordinating care.  Cc:  Madison Hickman, FNP  Sharene Butters 12/16/2021 9:48 AM

## 2021-12-16 NOTE — Patient Instructions (Signed)
It was a pleasure to see you today at our office.   Recommendations:  Follow up in  6 months Continue donepezil 10 mg daily. Side effects were discussed    RECOMMENDATIONS FOR ALL PATIENTS WITH MEMORY PROBLEMS: 1. Continue to exercise (Recommend 30 minutes of walking everyday, or 3 hours every week) 2. Increase social interactions - continue going to [employer] and enjoy social gatherings with friends and family 3. Eat healthy, avoid fried foods and eat more fruits and vegetables 4. Maintain adequate blood pressure, blood sugar, and blood cholesterol level. Reducing the risk of stroke and cardiovascular disease also helps promoting better memory. 5. Avoid stressful situations. Live a simple life and avoid aggravations. Organize your time and prepare for the next day in anticipation. 6. Sleep well, avoid any interruptions of sleep and avoid any distractions in the bedroom that may interfere with adequate sleep quality 7. Avoid sugar, avoid sweets as there is a strong link between excessive sugar intake, diabetes, and cognitive impairment We discussed the Mediterranean diet, which has been shown to help patients reduce the risk of progressive memory disorders and reduces cardiovascular risk. This includes eating fish, eat fruits and green leafy vegetables, nuts like almonds and hazelnuts, walnuts, and also use olive oil. Avoid fast foods and fried foods as much as possible. Avoid sweets and sugar as sugar use has been linked to worsening of memory function.  There is always a concern of gradual progression of memory problems. If this is the case, then we may need to adjust level of care according to patient needs. Support, both to the patient and caregiver, should then be put into place.    FALL PRECAUTIONS: Be cautious when walking. Scan the area for obstacles that may increase the risk of trips and falls. When getting up in the mornings, sit up at the edge of the bed for a few minutes before getting  out of bed. Consider elevating the bed at the head end to avoid drop of blood pressure when getting up. Walk always in a well-lit room (use night lights in the walls). Avoid area rugs or power cords from appliances in the middle of the walkways. Use a walker or a cane if necessary and consider physical therapy for balance exercise. Get your eyesight checked regularly.  FINANCIAL OVERSIGHT: Supervision, especially oversight when making financial decisions or transactions is also recommended.  HOME SAFETY: Consider the safety of the kitchen when operating appliances like stoves, microwave oven, and blender. Consider having supervision and share cooking responsibilities until no longer able to participate in those. Accidents with firearms and other hazards in the house should be identified and addressed as well.   ABILITY TO BE LEFT ALONE: If patient is unable to contact 911 operator, consider using LifeLine, or when the need is there, arrange for someone to stay with patients. Smoking is a fire hazard, consider supervision or cessation. Risk of wandering should be assessed by caregiver and if detected at any point, supervision and safe proof recommendations should be instituted.  MEDICATION SUPERVISION: Inability to self-administer medication needs to be constantly addressed. Implement a mechanism to ensure safe administration of the medications.   DRIVING: Regarding driving, in patients with progressive memory problems, driving will be impaired. We advise to have someone else do the driving if trouble finding directions or if minor accidents are reported. Independent driving assessment is available to determine safety of driving.   If you are interested in the driving assessment, you can contact the following:    [employer] in [place]  [place]  [place]  [facility] [phone] or [phone]  To  help prevent Alzheimer's dementia: 1.  Proper sleep (8 hours a night) 2.  Routine Exercise (at least 10,000 steps per day) 3.  Socialize.  Interact with others.  Meet friends for lunch.  Visit family. 4.  Learn new things:  Read a book about a topic that interests you, watch a documentary, read the newspaper, talk to friends about different topics. 5.  Mediterranean diet  Mediterranean Diet A Mediterranean diet refers to food and lifestyle choices that are based on the traditions of countries located on the [employer]. This way of eating has been shown to help prevent certain conditions and improve outcomes for people who have chronic diseases, like kidney disease and heart disease. What are tips for following this plan? Lifestyle  Cook and eat meals together with your family, when possible. Drink enough fluid to keep your urine clear or pale yellow. Be physically active every day. This includes: Aerobic exercise like running or swimming. Leisure activities like gardening, walking, or housework. Get 7-8 hours of sleep each night. If recommended by your health care provider, drink red wine in moderation. This means 1 glass a day for nonpregnant women and 2 glasses a day for men. A glass of wine equals 5 oz (150 mL). Reading food labels  Check the serving size of packaged foods. For foods such as rice and pasta, the serving size refers to the amount of cooked product, not dry. Check the total fat in packaged foods. Avoid foods that have saturated fat or trans fats. Check the ingredients list for added sugars, such as corn syrup. Shopping  At the grocery store, buy most of your food from the areas near the walls of the store. This includes: Fresh fruits and vegetables (produce). Grains, beans, nuts, and seeds. Some of these may be available in unpackaged forms or large amounts (in bulk). Fresh seafood. Poultry and eggs. Low-fat dairy products. Buy whole ingredients instead of  prepackaged foods. Buy fresh fruits and vegetables in-season from local farmers markets. Buy frozen fruits and vegetables in resealable bags. If you do not have access to quality fresh seafood, buy precooked frozen shrimp or canned fish, such as tuna, salmon, or sardines. Buy small amounts of raw or cooked vegetables, salads, or olives from the deli or salad bar at your store. Stock your pantry so you always have certain foods on hand, such as olive oil, canned tuna, canned tomatoes, rice, pasta, and beans. Cooking  Cook foods with extra-virgin olive oil instead of using butter or other vegetable oils. Have meat as a side dish, and have vegetables or grains as your main dish. This means having meat in small portions or adding small amounts of meat to foods like pasta or stew. Use beans or vegetables instead of meat in common dishes like chili or lasagna. Experiment with different cooking methods. Try roasting or broiling vegetables instead of steaming or sauteing them. Add frozen vegetables to soups, stews, pasta, or rice. Add nuts or seeds for added healthy fat at each meal. You can add these to yogurt, salads, or vegetable dishes. Marinate fish or vegetables using olive oil, lemon juice, garlic, and fresh herbs. Meal planning  Plan to eat 1 vegetarian meal one day each week. Try to work up to 2 vegetarian meals, if possible. Eat seafood 2 or more times a week. Have healthy snacks  readily available, such as: Vegetable sticks with hummus. Greek yogurt. Fruit and nut trail mix. Eat balanced meals throughout the week. This includes: Fruit: 2-3 servings a day Vegetables: 4-5 servings a day Low-fat dairy: 2 servings a day Fish, poultry, or lean meat: 1 serving a day Beans and legumes: 2 or more servings a week Nuts and seeds: 1-2 servings a day Whole grains: 6-8 servings a day Extra-virgin olive oil: 3-4 servings a day Limit red meat and sweets to only a few servings a month What are my  food choices? Mediterranean diet Recommended Grains: Whole-grain pasta. Brown rice. Bulgar wheat. Polenta. Couscous. Whole-wheat bread. [place]. Vegetables: Artichokes. Beets. Broccoli. Cabbage. Carrots. Eggplant. Green beans. Chard. Kale. Spinach. Onions. Leeks. Peas. Squash. Tomatoes. Peppers. Radishes. Fruits: Apples. Apricots. Avocado. Berries. Bananas. Cherries. Dates. Figs. Grapes. Lemons. Melon. Oranges. Peaches. Plums. Pomegranate. Meats and other protein foods: Beans. Almonds. Sunflower seeds. Pine nuts. Peanuts. [place]. Salmon. Scallops. Shrimp. [place]. Tilapia. Clams. Oysters. Eggs. Dairy: Low-fat milk. Cheese. Greek yogurt. Beverages: Water. Red wine. Herbal tea. Fats and oils: Extra virgin olive oil. Avocado oil. Grape seed oil. Sweets and desserts: [place] yogurt with honey. Baked apples. Poached pears. Trail mix. Seasoning and other foods: Basil. Cilantro. Coriander. Cumin. Mint. Parsley. Sage. Rosemary. Tarragon. Garlic. Oregano. Thyme. Pepper. Balsalmic vinegar. Tahini. Hummus. Tomato sauce. Olives. Mushrooms. Limit these Grains: Prepackaged pasta or rice dishes. Prepackaged cereal with added sugar. Vegetables: Deep fried potatoes (french fries). Fruits: Fruit canned in syrup. Meats and other protein foods: Beef. Pork. Lamb. Poultry with skin. Hot dogs. Berniece Salines. Dairy: Ice cream. Sour cream. Whole milk. Beverages: Juice. Sugar-sweetened soft drinks. Beer. Liquor and spirits. Fats and oils: Butter. Canola oil. Vegetable oil. Beef fat (tallow). Lard. Sweets and desserts: Cookies. Cakes. Pies. Candy. Seasoning and other foods: Mayonnaise. Premade sauces and marinades. The items listed may not be a complete list. Talk with your dietitian about what dietary choices are right for you. Summary The Mediterranean diet includes both food and lifestyle choices. Eat a variety of fresh fruits and vegetables, beans, nuts, seeds, and whole grains. Limit the amount of red meat and sweets  that you eat. Talk with your health care provider about whether it is safe for you to drink red wine in moderation. This means 1 glass a day for nonpregnant women and 2 glasses a day for men. A glass of wine equals 5 oz (150 mL). This information is not intended to replace advice given to you by your health care provider. Make sure you discuss any questions you have with your health care provider. Document Released: 09/23/2015 Document Revised: 10/26/2015 Document Reviewed: 09/23/2015 Elsevier Interactive Patient Education  2017 [employer].

## 2022-04-05 ENCOUNTER — Encounter: Payer: Self-pay | Admitting: Gastroenterology

## 2022-07-07 ENCOUNTER — Encounter: Payer: Self-pay | Admitting: Physician Assistant

## 2022-07-07 ENCOUNTER — Other Ambulatory Visit: Payer: Medicare HMO

## 2022-07-07 ENCOUNTER — Ambulatory Visit: Payer: Medicare HMO | Admitting: Physician Assistant

## 2022-07-07 VITALS — BP 133/80 | HR 74 | Resp 18 | Ht 68.0 in | Wt 174.0 lb

## 2022-07-07 DIAGNOSIS — G3184 Mild cognitive impairment, so stated: Secondary | ICD-10-CM | POA: Diagnosis not present

## 2022-07-07 MED ORDER — DONEPEZIL HCL 10 MG PO TABS: 23.0000 mg | ORAL_TABLET | Freq: Every day | ORAL | 11 refills | Status: DC

## 2022-07-07 NOTE — Patient Instructions (Addendum)
It was a pleasure to see you today at our office.   Recommendations:  Follow up in  6 months  donepezil 23 mg daily. Side effects were discussed  Referral to [facility] puncture    RECOMMENDATIONS FOR ALL PATIENTS WITH MEMORY PROBLEMS: 1. Continue to exercise (Recommend 30 minutes of walking everyday, or 3 hours every week) 2. Increase social interactions - continue going to [place] and enjoy social gatherings with friends and family 3. Eat healthy, avoid fried foods and eat more fruits and vegetables 4. Maintain adequate blood pressure, blood sugar, and blood cholesterol level. Reducing the risk of stroke and cardiovascular disease also helps promoting better memory. 5. Avoid stressful situations. Live a simple life and avoid aggravations. Organize your time and prepare for the next day in anticipation. 6. Sleep well, avoid any interruptions of sleep and avoid any distractions in the bedroom that may interfere with adequate sleep quality 7. Avoid sugar, avoid sweets as there is a strong link between excessive sugar intake, diabetes, and cognitive impairment We discussed the Mediterranean diet, which has been shown to help patients reduce the risk of progressive memory disorders and reduces cardiovascular risk. This includes eating fish, eat fruits and green leafy vegetables, nuts like almonds and hazelnuts, walnuts, and also use olive oil. Avoid fast foods and fried foods as much as possible. Avoid sweets and sugar as sugar use has been linked to worsening of memory function.  There is always a concern of gradual progression of memory problems. If this is the case, then we may need to adjust level of care according to patient needs. Support, both to the patient and caregiver, should then be put into place.    FALL PRECAUTIONS: Be cautious when walking. Scan the area for obstacles that may increase the risk of trips and falls. When getting up in the mornings, sit up at the edge of the bed for  a few minutes before getting out of bed. Consider elevating the bed at the head end to avoid drop of blood pressure when getting up. Walk always in a well-lit room (use night lights in the walls). Avoid area rugs or power cords from appliances in the middle of the walkways. Use a walker or a cane if necessary and consider physical therapy for balance exercise. Get your eyesight checked regularly.  FINANCIAL OVERSIGHT: Supervision, especially oversight when making financial decisions or transactions is also recommended.  HOME SAFETY: Consider the safety of the kitchen when operating appliances like stoves, microwave oven, and blender. Consider having supervision and share cooking responsibilities until no longer able to participate in those. Accidents with firearms and other hazards in the house should be identified and addressed as well.   ABILITY TO BE LEFT ALONE: If patient is unable to contact 911 operator, consider using LifeLine, or when the need is there, arrange for someone to stay with patients. Smoking is a fire hazard, consider supervision or cessation. Risk of wandering should be assessed by caregiver and if detected at any point, supervision and safe proof recommendations should be instituted.  MEDICATION SUPERVISION: Inability to self-administer medication needs to be constantly addressed. Implement a mechanism to ensure safe administration of the medications.   DRIVING: Regarding driving, in patients with progressive memory problems, driving will be impaired. We advise to have someone else do the driving if trouble finding directions or if minor accidents are reported. Independent driving assessment is available to determine safety of driving.   If you are interested in the driving  assessment, you can contact the following:  [employer] in [place] [phone]  [facility] [phone]  [facility] [phone]  [facility]  [phone] or [phone]  To help prevent Alzheimer's dementia: 1.  Proper sleep (8 hours a night) 2.  Routine Exercise (at least 10,000 steps per day) 3.  Socialize.  Interact with others.  Meet friends for lunch.  Visit family. 4.  Learn new things:  Read a book about a topic that interests you, watch a documentary, read the newspaper, talk to friends about different topics. 5.  Mediterranean diet  Mediterranean Diet A Mediterranean diet refers to food and lifestyle choices that are based on the traditions of countries located on the [employer]. This way of eating has been shown to help prevent certain conditions and improve outcomes for people who have chronic diseases, like kidney disease and heart disease. What are tips for following this plan? Lifestyle  Cook and eat meals together with your family, when possible. Drink enough fluid to keep your urine clear or pale yellow. Be physically active every day. This includes: Aerobic exercise like running or swimming. Leisure activities like gardening, walking, or housework. Get 7-8 hours of sleep each night. If recommended by your health care provider, drink red wine in moderation. This means 1 glass a day for nonpregnant women and 2 glasses a day for men. A glass of wine equals 5 oz (150 mL). Reading food labels  Check the serving size of packaged foods. For foods such as rice and pasta, the serving size refers to the amount of cooked product, not dry. Check the total fat in packaged foods. Avoid foods that have saturated fat or trans fats. Check the ingredients list for added sugars, such as corn syrup. Shopping  At the grocery store, buy most of your food from the areas near the walls of the store. This includes: Fresh fruits and vegetables (produce). Grains, beans, nuts, and seeds. Some of these may be available in unpackaged forms or large amounts (in bulk). Fresh seafood. Poultry and eggs. Low-fat dairy products. Buy  whole ingredients instead of prepackaged foods. Buy fresh fruits and vegetables in-season from local farmers markets. Buy frozen fruits and vegetables in resealable bags. If you do not have access to quality fresh seafood, buy precooked frozen shrimp or canned fish, such as tuna, salmon, or sardines. Buy small amounts of raw or cooked vegetables, salads, or olives from the deli or salad bar at your store. Stock your pantry so you always have certain foods on hand, such as olive oil, canned tuna, canned tomatoes, rice, pasta, and beans. Cooking  Cook foods with extra-virgin olive oil instead of using butter or other vegetable oils. Have meat as a side dish, and have vegetables or grains as your main dish. This means having meat in small portions or adding small amounts of meat to foods like pasta or stew. Use beans or vegetables instead of meat in common dishes like chili or lasagna. Experiment with different cooking methods. Try roasting or broiling vegetables instead of steaming or sauteing them. Add frozen vegetables to soups, stews, pasta, or rice. Add nuts or seeds for added healthy fat at each meal. You can add these to yogurt, salads, or vegetable dishes. Marinate fish or vegetables using olive oil, lemon juice, garlic, and fresh herbs. Meal planning  Plan to eat 1 vegetarian meal one day each week. Try to work up to 2 vegetarian meals, if possible. Eat seafood 2 or  more times a week. Have healthy snacks readily available, such as: Vegetable sticks with hummus. Greek yogurt. Fruit and nut trail mix. Eat balanced meals throughout the week. This includes: Fruit: 2-3 servings a day Vegetables: 4-5 servings a day Low-fat dairy: 2 servings a day Fish, poultry, or lean meat: 1 serving a day Beans and legumes: 2 or more servings a week Nuts and seeds: 1-2 servings a day Whole grains: 6-8 servings a day Extra-virgin olive oil: 3-4 servings a day Limit red meat and sweets to only a few  servings a month What are my food choices? Mediterranean diet Recommended Grains: Whole-grain pasta. Brown rice. Bulgar wheat. Polenta. Couscous. Whole-wheat bread. [place]. Vegetables: Artichokes. Beets. Broccoli. Cabbage. Carrots. Eggplant. Green beans. Chard. Kale. Spinach. Onions. Leeks. Peas. Squash. Tomatoes. Peppers. Radishes. Fruits: Apples. Apricots. Avocado. Berries. Bananas. Cherries. Dates. Figs. Grapes. Lemons. Melon. Oranges. Peaches. Plums. Pomegranate. Meats and other protein foods: Beans. Almonds. Sunflower seeds. Pine nuts. Peanuts. Cod. Salmon. Scallops. Shrimp. Tuna. Tilapia. Clams. Oysters. Eggs. Dairy: Low-fat milk. Cheese. Greek yogurt. Beverages: Water. Red wine. Herbal tea. Fats and oils: Extra virgin olive oil. Avocado oil. Grape seed oil. Sweets and desserts: [place] yogurt with honey. Baked apples. Poached pears. Trail mix. Seasoning and other foods: Basil. Cilantro. Coriander. Cumin. Mint. Parsley. Sage. Rosemary. Tarragon. Garlic. Oregano. Thyme. Pepper. Balsalmic vinegar. Tahini. Hummus. Tomato sauce. Olives. Mushrooms. Limit these Grains: Prepackaged pasta or rice dishes. Prepackaged cereal with added sugar. Vegetables: Deep fried potatoes (french fries). Fruits: Fruit canned in syrup. Meats and other protein foods: Beef. Pork. Lamb. Poultry with skin. Hot dogs. [place]. Dairy: Ice cream. Sour cream. Whole milk. Beverages: Juice. Sugar-sweetened soft drinks. Beer. Liquor and spirits. Fats and oils: Butter. Canola oil. Vegetable oil. Beef fat (tallow). Lard. Sweets and desserts: Cookies. Cakes. Pies. Candy. Seasoning and other foods: Mayonnaise. Premade sauces and marinades. The items listed may not be a complete list. Talk with your dietitian about what dietary choices are right for you. Summary The Mediterranean diet includes both food and lifestyle choices. Eat a variety of fresh fruits and vegetables, beans, nuts, seeds, and whole grains. Limit the  amount of red meat and sweets that you eat. Talk with your health care provider about whether it is safe for you to drink red wine in moderation. This means 1 glass a day for nonpregnant women and 2 glasses a day for men. A glass of wine equals 5 oz (150 mL). This information is not intended to replace advice given to you by your health care provider. Make sure you discuss any questions you have with your health care provider. Document Released: 09/23/2015 Document Revised: 10/26/2015 Document Reviewed: 09/23/2015 Elsevier Interactive Patient Education  2017 [employer]     Lumbar Puncture will be at [facility] [phone]

## 2022-07-07 NOTE — Progress Notes (Addendum)
Assessment/Plan:   Mild cognitive impairment of unclear etiology  Charles Carr is a very pleasant 72 y.o. 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 Patient is on donepezil 10 mg daily, tolerating well.  MMSE today is 24/30.  Patient is interested in opinion at [facility], however will need a formal diagnosis prior to that referral.  Will proceed with lumbar puncture for formal diagnosis of Alzheimer's disease.   Recommendations:   Follow up in 6 months. Repeat Neuropsych evaluation for clarity of the diagnosis and disease trajectory. Continue donepezil increase to 23 mg daily . Lumbar puncture for formal diagnosis of Alzheimer's disease Referral to [facility] for second opinion if Alzheimer's disease diagnosis is obtained. Recommend good control of cardiovascular risk factors Continue to control mood as per PCP    Subjective:   This patient is accompanied in the office by his wife who supplements the history. Previous records as well as any outside records available were reviewed prior to todays visit.   Patient was last seen on 12/16/2021.  Last MoCA was 17/30.    Any changes in memory since last visit?  Some days better than other".  His main concern is difficulty remembering the dates or recalling names of people that he needs.  "My short-term is not very good ".  The long-term memory is good. repeats oneself?  Endorsed Disoriented when walking into a room?  Patient denies   Leaving objects in unusual places?  Patient denies   Wandering behavior?   denies   Any personality changes since last visit?   denies   Any worsening depression?: denies   Hallucinations or paranoia?  denies    Seizures?   denies    Any sleep changes?  Sleeps well. Denies  vivid dreams, REM behavior or sleepwalking   Sleep apnea?   denies   Any hygiene concerns?   denies   Independent of bathing and dressing?  Endorsed  Does the patient needs help with medications? Wife is in charge   Who is in charge of the finances?  Wife  is in charge     Any changes in appetite?  Denies.  He admits not drinking enough water.  Patient have trouble swallowing?  denies   Does the patient cook?  Any kitchen accidents such as leaving the stove on?   denies   Any headaches?    denies   Vision changes? denies Chronic back pain  denies   Ambulates with difficulty?    denies   Recent falls or head injuries?    denies     Unilateral weakness, numbness or tingling?   denies   Any tremors?  Minimal intention tremors, unchanged from prior Any anosmia?    denies   Any incontinence of urine?  denies   Any bowel dysfunction?  denies      Patient lives he recently downsized,trying to adjust to the new house in [place].  Does the patient drive?  Yes, he denies any changes.  He denies getting lost.  Neuropsychological testing on 11/21/2021. Briefly, results suggested a fairly isolated impairment surrounding all aspects of verbal learning and memory. Broadly speaking, performances were appropriate relative to age-matched peers and premorbid intellectual estimations across all  other assessed cognitive domains. The etiology for ongoing memory impairment is unclear at the present time. Across verbal memory measures, Charles Carr showed minimal benefit from repeated exposure to information, had difficulty recalling previously learned information after brief delays, and performed poorly across yes/no recognition tasks. Retention rates across verbal memory tasks ranged from 0% to 50%. Taken together, this suggests some concern surrounding rapid forgetting and an evolving memory storage impairment, both of which can be concerning signs for  Alzheimer's disease. However, if this illness is indeed present, it would appear to be in extremely early stages given that visual memory remained intact, as well as all other cognitive domains. Time will need to pass before we can more definitively comment on concerns for Alzheimer's disease and the risk for progressive cognitive decline.    Neuropsych evaluation, Dr. Milbert Coulter 11/10/21 Briefly, results suggested a fairly isolated impairment surrounding all aspects of verbal learning and memory. Broadly speaking, performances were appropriate relative to age-matched peers and premorbid intellectual estimations across all other assessed cognitive domains. The etiology for ongoing memory impairment is unclear at the present time. Across verbal memory measures, Charles Carr showed minimal benefit from repeated exposure to information, had difficulty recalling previously learned information after brief delays, and performed poorly across yes/no recognition tasks. Retention rates across verbal memory tasks ranged from 0% to 50%. Taken together, this suggests some concern surrounding rapid forgetting and an evolving memory storage impairment, both of which can be concerning signs for Alzheimer's disease. However, if this illness is indeed present, it would appear to be in extremely early stages given that visual memory remained intact, as well as all other cognitive domains. Time will need to pass before we can more definitively comment on concerns for Alzheimer's disease and the risk for progressive cognitive decline.       Initial Visit 1/30/23The patient is seen in neurologic consultation at the request of Hal Morales, NP for the evaluation of memory.  The patient is accompanied by his wife who supplements the history. This is a 72 y.o. year old RH  male who has had memory issues for about 2 years, when he began noticing he could not remember what his wife had told him in the recent conversation.  He also began to  forget his on passwords, symptoms that had been worsening over the last year.  He initially visited his PCP in August 2021 with these complaints, had an MRI of the brain which was showing mild volume changes, otherwise negative.  His PCP placed him on donepezil 5 mg daily in March 2022, but as his memory changes were more evident, as of December 2022 this was increased to 10 mg daily.  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  Despite cauterization, he continued to have problems with short-term memory.  He denies leaving objects in unusual places.  He continues to drive and denies getting lost.  He denies any mood changes or depression.  He does have a history of GAD, well controlled with antianxiety medications.  He is not very active, never exercised much but recently he got a dog, and he is walking on a daily basis.  He does not like to read, do crossword puzzles or word finding.  He sleeps well.  His wife reports that for the last 10 years, he has been acting out in his dreams,  at times moving his hands, at one point it was worse, "is better now since the pill ".  He denies hallucinations or paranoia.  There are no hygiene concerns, he is independent of bathing and dressing.  He is in charge of the medications, and denies missing any doses.  He is also in charge of the bills, and denies missing any payments.  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  His appetite is not as good as it was before according to him.  He denies trouble swallowing.  He does not cook.  He ambulates without difficulty, denies any falls or ever having a head injury.  He has intermittent, chronic frontal headaches, about twice a week, takes Tylenol  with resolution of it.  No photophobia, nausea or vomiting with it.  He denies double vision, dizziness, focal numbness, tingling, unilateral weakness, or anosmia.  He had COVID in March 2022.  He has mild right intention tremors, present for several years.  No history of seizures.  He denies urine incontinence, retention, constipation or diarrhea.  He denies sleep apnea, alcohol or tobacco history.  Family history remarkable for mother and father with Alzheimer's disease.   MRI of the brain on 10/14/2019 was remarkable for no acute findings and mild age related volume loss. Single punctate T2 and FLAIR bright focus within the left frontal subcortical white matter, not likely significant and less than often seen at this age.   Labs 01/18/2021 remarkable for hemoglobin 11.5, hematocrit 37.4, MCV is low at 76.8 suspicious for microcytic anemia  Past Medical History:  Diagnosis Date   GAVE (gastric antral vascular ectasia) 07/02/2017   GERD (gastroesophageal reflux disease)    History of colonic polyps 07/02/2017   Iron deficiency anemia 07/02/2017   Mild cognitive impairment with memory loss 11/21/2021   Personal history of kidney stones 07/02/2017     Past Surgical History:  Procedure Laterality Date   APPENDECTOMY     COLONOSCOPY     ESOPHAGOGASTRODUODENOSCOPY (EGD) WITH PROPOFOL N/A 02/26/2014   Procedure: ESOPHAGOGASTRODUODENOSCOPY (EGD) WITH PROPOFOL;  Surgeon: Rachael Fee, MD;  Location: [facility];  Service: Endoscopy;  Laterality: N/A;  APC   ESOPHAGOGASTRODUODENOSCOPY (EGD) WITH PROPOFOL N/A 04/16/2014   Procedure: ESOPHAGOGASTRODUODENOSCOPY (EGD) WITH PROPOFOL;  Surgeon: Rachael Fee, MD;  Location: [facility];  Service: Endoscopy;  Laterality: N/A;   ESOPHAGOGASTRODUODENOSCOPY (EGD) WITH PROPOFOL N/A 07/26/2017   Procedure: ESOPHAGOGASTRODUODENOSCOPY (EGD) WITH PROPOFOL;  Surgeon: Rachael Fee, MD;  Location: [facility];  Service: Endoscopy;  Laterality: N/A;    ESOPHAGOGASTRODUODENOSCOPY (EGD) WITH PROPOFOL N/A 02/17/2021   Procedure: ESOPHAGOGASTRODUODENOSCOPY (EGD) WITH PROPOFOL;  Surgeon: Rachael Fee, MD;  Location: [facility];  Service: Endoscopy;  Laterality: N/A;   ESOPHAGOGASTRODUODENOSCOPY ENDOSCOPY     [facility]   HOT HEMOSTASIS N/A 02/26/2014   Procedure: HOT HEMOSTASIS (ARGON PLASMA COAGULATION/BICAP);  Surgeon: Rachael Fee, MD;  Location: [facility];  Service: Endoscopy;  Laterality: N/A;   HOT HEMOSTASIS N/A 07/26/2017   Procedure: HOT HEMOSTASIS (ARGON PLASMA COAGULATION/BICAP);  Surgeon: Rachael Fee, MD;  Location: [facility];  Service: Endoscopy;  Laterality: N/A;   HOT HEMOSTASIS N/A 02/17/2021   Procedure: HOT HEMOSTASIS (ARGON PLASMA COAGULATION/BICAP);  Surgeon: Rachael Fee, MD;  Location: [facility];  Service: Endoscopy;  Laterality: N/A;   KIDNEY STONE SURGERY  x2   TONSILLECTOMY     UPPER GASTROINTESTINAL ENDOSCOPY       PREVIOUS MEDICATIONS:   CURRENT MEDICATIONS:  Outpatient Encounter Medications as of 07/07/2022  Medication Sig   acetaminophen (TYLENOL) 500 MG tablet Take 1,000 mg by mouth every 6 (six) hours as needed (pain).   ALPRAZolam (XANAX) 0.25 MG tablet Take 0.25 mg by mouth in the morning.   ferrous sulfate 325 (65 FE) MG tablet Take 325 mg by mouth in the morning.   omeprazole (PRILOSEC) 40 MG capsule Take at least 30 minutes before breakfast on an empty stomach   rosuvastatin (CRESTOR) 5 MG tablet Take 5 mg by mouth in the morning.   [DISCONTINUED] donepezil (ARICEPT) 10 MG tablet Take 23 mg by mouth at bedtime.   donepezil (ARICEPT) 10 MG tablet Take 2.5 tablets (25 mg total) by mouth at bedtime.   No facility-administered encounter medications on file as of 07/07/2022.     Objective:     PHYSICAL EXAMINATION:    VITALS:   Vitals:   07/07/22 1045  BP: 133/80  Pulse: 74  Resp: 18  SpO2: 98%  Weight: 174 lb (78.9 kg)  Height: 5\' 8"  (1.727 m)    GEN:  The  patient appears stated age and is in NAD. HEENT:  Normocephalic, atraumatic.   Neurological examination:  General: NAD, well-groomed, appears stated age. Orientation: The patient is alert. Oriented to person, place and not to date Cranial nerves: There is good facial symmetry.The speech is fluent and clear. No aphasia or dysarthria. Fund of knowledge is appropriate. Recent memory impaired and remote memory is normal.  Attention and concentration are normal.  Able to name objects and repeat phrases.  Hearing is intact to conversational tone.   Delayed recall 1/3 Sensation: Sensation is intact to light touch throughout Motor: Strength is at least antigravity x4. Tremors: Minimal are you he intention tremor, no cogwheeling. DTR's 2/4 in UE/LE      03/14/2021   10:00 AM  Montreal Cognitive Assessment   Visuospatial/ Executive (0/5) 2  Naming (0/3) 3  Attention: Read list of digits (0/2) 2  Attention: Read list of letters (0/1) 1  Attention: Serial 7 subtraction starting at 100 (0/3) 2  Language: Repeat phrase (0/2) 1  Language : Fluency (0/1) 0  Abstraction (0/2) 1  Delayed Recall (0/5) 0  Orientation (0/6) 4  Total 16  Adjusted Score (based on education) 17       06/14/2021    1:00 PM  MMSE - Mini Mental State Exam  Orientation to time 5  Orientation to Place 5  Registration 3  Attention/ Calculation 3  Recall 3  Language- name 2 objects 2  Language- repeat 1  Language- follow 3 step command 3  Language- read & follow direction 1  Write a sentence 1  Copy design 1  Total score 28       Movement examination: Tone: There is normal tone in the UE/LE Abnormal movements:   No myoclonus.  No asterixis.   Coordination:  There is no decremation with RAM's. Normal finger to nose  Gait and Station: The patient has no difficulty arising out of a deep-seated chair without the use of the hands. [name] stride length is good.  Gait is cautious and narrow.   Thank you for  allowing [place] the opportunity to participate in the care of this nice patient. Please do not hesitate to contact us for any questions or concerns.   Total time  spent on today's visit was 30 minutes dedicated to this patient today, preparing to see patient, examining the patient, ordering tests and/or medications and counseling the patient, documenting clinical information in the EHR or other health record, independently interpreting results and communicating results to the patient/family, discussing treatment and goals, answering patient's questions and coordinating care.  Cc:  Jerrye Bushy, FNP  Marlowe Kays 07/07/2022 12:00 PM

## 2022-07-11 ENCOUNTER — Telehealth: Payer: Self-pay | Admitting: Physician Assistant

## 2022-07-11 NOTE — Telephone Encounter (Signed)
Will call and send orders again

## 2022-07-11 NOTE — Telephone Encounter (Signed)
Summer with [facility] left a message with the AN. She needs is calling about a sample, order is stuck in mailing system, needs order [phone]

## 2022-07-11 NOTE — Telephone Encounter (Signed)
This was taken care of thanked me for calling back.

## 2022-07-14 ENCOUNTER — Other Ambulatory Visit (HOSPITAL_COMMUNITY): Payer: Self-pay

## 2022-07-14 ENCOUNTER — Telehealth: Payer: Self-pay | Admitting: Pharmacy Technician

## 2022-07-14 ENCOUNTER — Telehealth: Payer: Self-pay | Admitting: Physician Assistant

## 2022-07-14 NOTE — Telephone Encounter (Signed)
Pt's wife called [facility] and stated Charles Carr had suggested he come off of Xanax and try something different due to the pt's dementia. They are just wanting to see if Huntley Dec has any suggestions of a type of medication to try?

## 2022-07-14 NOTE — Telephone Encounter (Signed)
Patient Advocate Encounter  Received notification from [facility] that prior authorization for DONEPEZIL 10MG  is required.   PA submitted on 5.31.24 Key [id number]  Status is pending

## 2022-07-18 ENCOUNTER — Encounter: Payer: Self-pay | Admitting: Gastroenterology

## 2022-07-18 NOTE — Telephone Encounter (Signed)
F/u  [facility] calling following up on previous message. Charles Carr

## 2022-07-18 NOTE — Telephone Encounter (Signed)
Advised to see if he can tolerate lexapro per Jerelyn Charles

## 2022-07-20 ENCOUNTER — Telehealth: Payer: Self-pay | Admitting: Physician Assistant

## 2022-07-20 NOTE — Telephone Encounter (Signed)
[facility] called and stated that patients insurance will not cover his Aricept Rx as written for the 2 1/2 tabs BID. However, they will cover 23mg  tab QD. Please call Giselle at [facility] and advise if they can update/change to Rx. She can be reached at [phone].

## 2022-07-21 ENCOUNTER — Telehealth: Payer: Self-pay

## 2022-07-21 MED ORDER — DONEPEZIL HCL 23 MG PO TABS: 23.0000 mg | ORAL_TABLET | Freq: Every day | ORAL | 3 refills | Status: DC

## 2022-07-21 NOTE — Telephone Encounter (Signed)
Needs aricept 5 bid changed to 23mg  for insurance to pay thanks. Please send in new Rx.

## 2022-07-21 NOTE — Addendum Note (Signed)
Addended by: Marlowe Kays E on: 07/21/2022 07:37 PM   Modules accepted: Orders

## 2022-08-14 ENCOUNTER — Other Ambulatory Visit (HOSPITAL_COMMUNITY)
Admission: RE | Admit: 2022-08-14 | Discharge: 2022-08-14 | Disposition: A | Discharge status: Home / Self Care | Payer: Medicare HMO | Source: Ambulatory Visit | Attending: Physician Assistant | Admitting: Physician Assistant

## 2022-08-14 ENCOUNTER — Ambulatory Visit
Admission: RE | Admit: 2022-08-14 | Discharge: 2022-08-14 | Disposition: A | Discharge status: Home / Self Care | Payer: Medicare HMO | Source: Ambulatory Visit | Attending: Physician Assistant | Admitting: Physician Assistant

## 2022-08-14 DIAGNOSIS — G3184 Mild cognitive impairment, so stated: Secondary | ICD-10-CM | POA: Insufficient documentation

## 2022-08-14 NOTE — Discharge Instructions (Signed)

## 2022-08-14 NOTE — Progress Notes (Signed)
Blood drawn from pts Right hand to be sent with LP lab work. Pt tolerated well. Gauze and tape applied after.

## 2022-08-15 LAB — CYTOLOGY - NON PAP

## 2022-08-21 LAB — HERPES SIMPLEX VIRUS, TYPE 1 AND 2 DNA,QUAL,RT PCR
HSV 1 DNA: NOT DETECTED
HSV 2 DNA: NOT DETECTED

## 2022-08-31 LAB — CSF CELL COUNT WITH DIFFERENTIAL
RBC Count, CSF: 6 cells/uL — ABNORMAL HIGH
TOTAL NUCLEATED CELL: 1 cells/uL (ref 0–5)

## 2022-08-31 LAB — MAYO MISC ORDER 2: PRICE:: 1288

## 2022-08-31 LAB — ANGIOTENSIN CONVERTING ENZYME, CSF: ANGIOTENSIN CONVERTING ENZYME ( ACE) CSF: 6 U/L (ref <=15)

## 2022-08-31 LAB — CSF CULTURE W GRAM STAIN
MICRO NUMBER:: 15146902
Result:: NO GROWTH
SPECIMEN QUALITY:: ADEQUATE

## 2022-08-31 LAB — OLIGOCLONAL BANDS, CSF + SERM: Oligo Bands: ABSENT

## 2022-08-31 LAB — TIQ- AMBIGUOUS ORDER

## 2022-08-31 LAB — MAYO MISC ORDER: PRICE:: 839.7

## 2022-08-31 LAB — MYELIN BASIC PROTEIN, CSF: Myelin Basic Protein: <2 mcg/L (ref <=4.0)

## 2022-08-31 LAB — VDRL, CSF: VDRL Quant, CSF: NONREACTIVE

## 2022-08-31 LAB — GLUCOSE, CSF: Glucose, CSF: 66 mg/dL (ref 40–80)

## 2022-08-31 LAB — CRYPTOCOCCAL AG, LTX SCR RFLX TITER: Cryptococcal Ag Screen: NOT DETECTED

## 2022-08-31 LAB — CNS IGG SYNTHESIS RATE, CSF+BLOOD
Albumin Serum: 4.6 g/dL (ref 3.6–5.1)
Albumin, CSF: 24.3 mg/dL (ref 8.0–42.0)
CNS-IgG Synthesis Rate: -2.9 mg/24 h (ref <=3.3)
IgG (Immunoglobin G), Serum: 1020 mg/dL (ref 600–1540)
IgG Total CSF: 2.6 mg/dL (ref 0.8–7.7)
IgG-Index: 0.48 (ref <0.70)

## 2022-08-31 LAB — PROTEIN, CSF: Total Protein, CSF: 52 mg/dL (ref 15–60)

## 2022-09-14 ENCOUNTER — Encounter: Payer: Self-pay | Admitting: Physician Assistant

## 2022-09-22 ENCOUNTER — Ambulatory Visit (AMBULATORY_SURGERY_CENTER): Payer: Medicare HMO

## 2022-09-22 ENCOUNTER — Encounter: Payer: Self-pay | Admitting: Gastroenterology

## 2022-09-22 VITALS — Ht 69.0 in | Wt 169.0 lb

## 2022-09-22 DIAGNOSIS — Z86010 Personal history of colonic polyps: Secondary | ICD-10-CM

## 2022-09-22 NOTE — Progress Notes (Signed)

## 2022-10-20 ENCOUNTER — Ambulatory Visit (AMBULATORY_SURGERY_CENTER): Payer: Medicare HMO | Admitting: Gastroenterology

## 2022-10-20 ENCOUNTER — Encounter: Payer: Self-pay | Admitting: Gastroenterology

## 2022-10-20 VITALS — BP 118/70 | HR 48 | Temp 97.0°F | Resp 15 | Ht 69.0 in | Wt 169.0 lb

## 2022-10-20 DIAGNOSIS — K635 Polyp of colon: Secondary | ICD-10-CM | POA: Diagnosis not present

## 2022-10-20 DIAGNOSIS — Z86010 Personal history of colonic polyps: Secondary | ICD-10-CM

## 2022-10-20 DIAGNOSIS — D128 Benign neoplasm of rectum: Secondary | ICD-10-CM

## 2022-10-20 DIAGNOSIS — Z09 Encounter for follow-up examination after completed treatment for conditions other than malignant neoplasm: Secondary | ICD-10-CM | POA: Diagnosis present

## 2022-10-20 DIAGNOSIS — D122 Benign neoplasm of ascending colon: Secondary | ICD-10-CM

## 2022-10-20 MED ORDER — SODIUM CHLORIDE 0.9 % IV SOLN
500.0000 mL | INTRAVENOUS | Status: DC
Start: 2022-10-20 — End: 2022-10-20

## 2022-10-20 NOTE — Progress Notes (Signed)
Called to room to assist during endoscopic procedure.  Patient ID and intended procedure confirmed with present staff. Received instructions for my participation in the procedure from the performing physician.  

## 2022-10-20 NOTE — Patient Instructions (Signed)
High Fiber Diet- handout provided Use FiberCon 1-2 tablets PO daily  YOU HAD AN ENDOSCOPIC PROCEDURE TODAY: Refer to the procedure report and other information in the discharge instructions given to you for any specific questions about what was found during the examination. If this information does not answer your questions, please call [place] office at [phone] to clarify.   YOU SHOULD EXPECT: Some feelings of bloating in the abdomen. Passage of more gas than usual. Walking can help get rid of the air that was put into your GI tract during the procedure and reduce the bloating. If you had a lower endoscopy (such as a colonoscopy or flexible sigmoidoscopy) you may notice spotting of blood in your stool or on the toilet paper. Some abdominal soreness may be present for a day or two, also.  DIET: Your first meal following the procedure should be a light meal and then it is ok to progress to your normal diet. A half-sandwich or bowl of soup is an example of a good first meal. Heavy or fried foods are harder to digest and may make you feel nauseous or bloated. Drink plenty of fluids but you should avoid alcoholic beverages for 24 hours. If you had a esophageal dilation, please see attached instructions for diet.    ACTIVITY: Your care partner should take you home directly after the procedure. You should plan to take it easy, moving slowly for the rest of the day. You can resume normal activity the day after the procedure however YOU SHOULD NOT DRIVE, use power tools, machinery or perform tasks that involve climbing or major physical exertion for 24 hours (because of the sedation medicines used during the test).   SYMPTOMS TO REPORT IMMEDIATELY: A gastroenterologist can be reached at any hour. Please call [phone]  for any of the following symptoms:  Following lower endoscopy (colonoscopy, flexible sigmoidoscopy) Excessive amounts of blood in the stool  Significant tenderness, worsening of  abdominal pains  Swelling of the abdomen that is new, acute  Fever of 100 or higher  FOLLOW UP:  If any biopsies were taken you will be contacted by phone or by letter within the next 1-3 weeks. Call [phone]  if you have not heard about the biopsies in 3 weeks.  Please also call with any specific questions about appointments or follow up tests.

## 2022-10-20 NOTE — Op Note (Signed)
[facility] Patient Name: Charles Carr Procedure Date: 10/20/2022 9:08 AM MRN: [id number] Endoscopist: Corliss Parish , MD, [id number] Age: 72 Referring MD:  Date of Birth: [birth year] Gender: Male Account #: [id number] Procedure:                Colonoscopy Indications:              Surveillance: Personal history of adenomatous                            polyps on last colonoscopy 3 years ago Medicines:                Monitored Anesthesia Care Procedure:                Pre-Anesthesia Assessment:                           - Prior to the procedure, a History and Physical                            was performed, and patient medications and                            allergies were reviewed. [name] tolerance of                            previous anesthesia was also reviewed. The risks                            and benefits of the procedure and the sedation                            options and risks were discussed with the patient.                            All questions were answered, and informed consent                            was obtained. Prior Anticoagulants: The patient has                            taken no anticoagulant or antiplatelet agents. ASA                            Grade Assessment: II - A patient with mild systemic                            disease. After reviewing the risks and benefits,                            the patient was deemed in satisfactory condition to                            undergo the procedure.  After obtaining informed consent, the colonoscope                            was passed under direct vision. Throughout the                            procedure, the patient's blood pressure, pulse, and                            oxygen saturations were monitored continuously. The                            Olympus PCF-H190DL (#[id number]) Colonoscope was                            introduced through the anus  and advanced to the 3                            cm into the ileum. The colonoscopy was performed                            without difficulty. The patient tolerated the                            procedure. The quality of the bowel preparation was                            good. The terminal ileum, ileocecal valve,                            appendiceal orifice, and rectum were photographed. Scope In: 9:22:36 AM Scope Out: 9:36:42 AM Scope Withdrawal Time: 0 hours 9 minutes 46 seconds  Total Procedure Duration: 0 hours 14 minutes 6 seconds  Findings:                 The digital rectal exam findings include                            hemorrhoids. Pertinent negatives include no                            palpable rectal lesions.                           The colon (entire examined portion) revealed                            moderately excessive looping.                           The terminal ileum and ileocecal valve appeared                            normal.  Two sessile polyps were found in the rectum and                            ascending colon. The polyps were 3 to 4 mm in size.                            These polyps were removed with a cold snare.                            Resection and retrieval were complete.                           Normal mucosa was found in the entire colon                            otherwise.                           Non-bleeding non-thrombosed external and internal                            hemorrhoids were found during retroflexion, during                            perianal exam and during digital exam. The                            hemorrhoids were Grade II (internal hemorrhoids                            that prolapse but reduce spontaneously). Complications:            No immediate complications. Estimated Blood Loss:     Estimated blood loss was minimal. Impression:               - Hemorrhoids found on digital  rectal exam.                           - There was significant looping of the colon.                           - The examined portion of the ileum was normal.                           - Two 3 to 4 mm polyps in the rectum and in the                            ascending colon, removed with a cold snare.                            Resected and retrieved.                           - Normal mucosa in the entire examined colon  otherwise.                           - Non-bleeding non-thrombosed external and internal                            hemorrhoids. Recommendation:           - The patient will be observed post-procedure,                            until all discharge criteria are met.                           - Discharge patient to home.                           - Patient has a contact number available for                            emergencies. The signs and symptoms of potential                            delayed complications were discussed with the                            patient. Return to normal activities tomorrow.                            Written discharge instructions were provided to the                            patient.                           - High fiber diet.                           - Use FiberCon 1-2 tablets PO daily.                           - Continue present medications.                           - Await pathology results.                           - Repeat colonoscopy in 5/7 years for surveillance                            based on pathology results and history of previous                            adenomas.                           - The findings and recommendations were discussed  with the patient.                           - The findings and recommendations were discussed                            with the patient's family. Corliss Parish, MD 10/20/2022 9:40:48 AM

## 2022-10-20 NOTE — Progress Notes (Unsigned)
Report to PACU, RN, vss, BBS= Clear.  

## 2022-10-20 NOTE — Progress Notes (Unsigned)
GASTROENTEROLOGY PROCEDURE H&P NOTE   Primary Care Physician: Jerrye Bushy, FNP  HPI: Charles Carr is a 72 y.o. male who presents for Colonoscopy for surveillance prior adenomas.  Past Medical History:  Diagnosis Date   GAVE (gastric antral vascular ectasia) 07/02/2017   GERD (gastroesophageal reflux disease)    History of colonic polyps 07/02/2017   Hyperlipidemia    Iron deficiency anemia 07/02/2017   Mild cognitive impairment with memory loss 11/21/2021   Personal history of kidney stones 07/02/2017   Past Surgical History:  Procedure Laterality Date   APPENDECTOMY     COLONOSCOPY     ESOPHAGOGASTRODUODENOSCOPY (EGD) WITH PROPOFOL N/A 02/26/2014   Procedure: ESOPHAGOGASTRODUODENOSCOPY (EGD) WITH PROPOFOL;  Surgeon: Rachael Fee, MD;  Location: [facility];  Service: Endoscopy;  Laterality: N/A;  APC   ESOPHAGOGASTRODUODENOSCOPY (EGD) WITH PROPOFOL N/A 04/16/2014   Procedure: ESOPHAGOGASTRODUODENOSCOPY (EGD) WITH PROPOFOL;  Surgeon: Rachael Fee, MD;  Location: [facility];  Service: Endoscopy;  Laterality: N/A;   ESOPHAGOGASTRODUODENOSCOPY (EGD) WITH PROPOFOL N/A 07/26/2017   Procedure: ESOPHAGOGASTRODUODENOSCOPY (EGD) WITH PROPOFOL;  Surgeon: Rachael Fee, MD;  Location: [facility];  Service: Endoscopy;  Laterality: N/A;   ESOPHAGOGASTRODUODENOSCOPY (EGD) WITH PROPOFOL N/A 02/17/2021   Procedure: ESOPHAGOGASTRODUODENOSCOPY (EGD) WITH PROPOFOL;  Surgeon: Rachael Fee, MD;  Location: [facility];  Service: Endoscopy;  Laterality: N/A;   ESOPHAGOGASTRODUODENOSCOPY ENDOSCOPY     [facility]   HOT HEMOSTASIS N/A 02/26/2014   Procedure: HOT HEMOSTASIS (ARGON PLASMA COAGULATION/BICAP);  Surgeon: Rachael Fee, MD;  Location: [facility];  Service: Endoscopy;  Laterality: N/A;   HOT HEMOSTASIS N/A 07/26/2017   Procedure: HOT HEMOSTASIS (ARGON PLASMA COAGULATION/BICAP);  Surgeon: Rachael Fee, MD;  Location: [facility];  Service: Endoscopy;  Laterality:  N/A;   HOT HEMOSTASIS N/A 02/17/2021   Procedure: HOT HEMOSTASIS (ARGON PLASMA COAGULATION/BICAP);  Surgeon: Rachael Fee, MD;  Location: [facility];  Service: Endoscopy;  Laterality: N/A;   KIDNEY STONE SURGERY     x2   TONSILLECTOMY     UPPER GASTROINTESTINAL ENDOSCOPY     Current Outpatient Medications  Medication Sig Dispense Refill   acetaminophen (TYLENOL) 500 MG tablet Take 1,000 mg by mouth every 6 (six) hours as needed (pain).     donepezil (ARICEPT) 23 MG TABS tablet Take 1 tablet (23 mg total) by mouth at bedtime. 90 tablet 3   escitalopram (LEXAPRO) 10 MG tablet Take by mouth.     ferrous sulfate 325 (65 FE) MG tablet Take 325 mg by mouth in the morning.     omeprazole (PRILOSEC) 40 MG capsule Take at least 30 minutes before breakfast on an empty stomach 30 capsule 11   rosuvastatin (CRESTOR) 5 MG tablet Take 5 mg by mouth in the morning.     Current Facility-Administered Medications  Medication Dose Route Frequency Provider Last Rate Last Admin   0.9 %  sodium chloride infusion  500 mL Intravenous Continuous Mansouraty, Netty Starring., MD        Current Outpatient Medications:    acetaminophen (TYLENOL) 500 MG tablet, Take 1,000 mg by mouth every 6 (six) hours as needed (pain)., Disp: , Rfl:    donepezil (ARICEPT) 23 MG TABS tablet, Take 1 tablet (23 mg total) by mouth at bedtime., Disp: 90 tablet, Rfl: 3   escitalopram (LEXAPRO) 10 MG tablet, Take by mouth., Disp: , Rfl:    ferrous sulfate 325 (65 FE) MG tablet, Take 325 mg by mouth in the morning., Disp: , Rfl:  omeprazole (PRILOSEC) 40 MG capsule, Take at least 30 minutes before breakfast on an empty stomach, Disp: 30 capsule, Rfl: 11   rosuvastatin (CRESTOR) 5 MG tablet, Take 5 mg by mouth in the morning., Disp: , Rfl:   Current Facility-Administered Medications:    0.9 %  sodium chloride infusion, 500 mL, Intravenous, Continuous, Mansouraty, Netty Starring., MD No Known Allergies Family History  Problem Relation Age  of Onset   Alzheimer's disease Mother    Dementia Mother    Dementia Father        unspecified type   Colon cancer Neg Hx    Esophageal cancer Neg Hx    Rectal cancer Neg Hx    Stomach cancer Neg Hx    Colon polyps Neg Hx    Social History   Socioeconomic History   Marital status: Married    Spouse name: Not on file   Number of children: Not on file   Years of education: 12   Highest education level: High school graduate  Occupational History   Occupation: Retired    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  Tobacco Use   Smoking status: Former    Current packs/day: 0.00    Types: Cigarettes    Quit date: 02/13/1988    Years since quitting: 34.7   Smokeless tobacco: Never  Vaping Use   Vaping status: Never Used  Substance and Sexual Activity   Alcohol use: No   Drug use: No   Sexual activity: Not on file  Other Topics Concern   Not on file  Social History Narrative   Right handed    Lives with wife   Caffeine intake yes   Retired, loves antiques   Social Determinants of Health   Financial Resource Strain: Not on file  Food Insecurity: Not on file  Transportation Needs: Not on file  Physical Activity: Not on file  Stress: Not on file  Social Connections: Not on file  Intimate Partner Violence: Not on file    Physical Exam: Today's Vitals   10/20/22 0825 10/20/22 0826 10/20/22 0912  BP: 135/73    Pulse: (!) 48  (!) 50  Resp:   14  Temp:  (!) 97 F (36.1 C)   SpO2: 99%  98%  Weight: 169 lb (76.7 kg)    Height: 5\' 9"  (1.753 m)     Body mass index is 24.96 kg/m. GEN: NAD EYE: Sclerae anicteric ENT: MMM CV: Non-tachycardic GI: Soft, NT/ND NEURO:  Alert & Oriented x 3  Lab Results: No results for input(s): "WBC", "HGB", "HCT", "PLT" in the last 72 hours. BMET No results for input(s): "NA", "K", "CL", "CO2", "GLUCOSE", "BUN", "CREATININE", "CALCIUM" in the last 72 hours. LFT No results for input(s): "PROT", "ALBUMIN", "AST", "ALT", "ALKPHOS",  "BILITOT", "BILIDIR", "IBILI" in the last 72 hours. PT/INR No results for input(s): "LABPROT", "INR" in the last 72 hours.   Impression / Plan: This is a 72 y.o.male who presents for Colonoscopy for surveillance prior adenomas.  The risks and benefits of endoscopic evaluation/treatment were discussed with the patient and/or family; these include but are not limited to the risk of perforation, infection, bleeding, missed lesions, lack of diagnosis, severe illness requiring hospitalization, as well as anesthesia and sedation related illnesses.  [name] history has been reviewed, patient examined, no change in status, and deemed stable for procedure.  The patient and/or family is agreeable to proceed.    Corliss Parish, MD  Gastroenterology Advanced Endoscopy Office # [phone]

## 2022-10-23 ENCOUNTER — Telehealth: Payer: Self-pay | Admitting: *Deleted

## 2022-10-23 NOTE — Telephone Encounter (Signed)
  Follow up Call-     10/20/2022    8:26 AM  Call back number  Post procedure Call Back phone  # [phone]  Permission to leave phone message Yes     Patient questions:  Message left to call us if necessary.

## 2022-10-24 ENCOUNTER — Encounter: Payer: Self-pay | Admitting: Gastroenterology

## 2022-10-30 ENCOUNTER — Encounter: Payer: Self-pay | Admitting: Gastroenterology

## 2022-11-06 ENCOUNTER — Other Ambulatory Visit: Payer: Self-pay

## 2022-11-06 MED ORDER — OMEPRAZOLE 40 MG PO CPDR: DELAYED_RELEASE_CAPSULE | ORAL | 6 refills | Status: DC

## 2022-12-29 ENCOUNTER — Encounter: Payer: Self-pay | Admitting: Physician Assistant

## 2022-12-29 ENCOUNTER — Ambulatory Visit: Payer: Medicare HMO | Admitting: Physician Assistant

## 2022-12-29 VITALS — BP 158/84 | HR 61 | Resp 20 | Ht 68.0 in

## 2022-12-29 DIAGNOSIS — F0670 Mild neurocognitive disorder due to known physiological condition without behavioral disturbance: Secondary | ICD-10-CM

## 2022-12-29 DIAGNOSIS — G3184 Mild cognitive impairment, so stated: Secondary | ICD-10-CM

## 2022-12-29 DIAGNOSIS — G309 Alzheimer's disease, unspecified: Secondary | ICD-10-CM

## 2022-12-29 DIAGNOSIS — R413 Other amnesia: Secondary | ICD-10-CM

## 2022-12-29 MED ORDER — DONEPEZIL HCL 23 MG PO TABS: 23.0000 mg | ORAL_TABLET | Freq: Every day | ORAL | 3 refills | Status: DC

## 2022-12-29 NOTE — Progress Notes (Signed)
Assessment/Plan:   Mild Cognitive  Impairment due to Alzheimer's Disease   Charles Carr is a very pleasant 72 y.o. 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 He had an LP in 07/2022, pTau/Abeta42 ratio was abnormal at 0.033, consistent with the presence of Alzheimer's disease. Patient is on donepezil 23 mg daily. MMSE today is 27/30. Memory is stable. Patient is able to participate on his ADLs and to drive without difficulties     Recommendations:   Follow up in 6  months. Continue Donepezil 23 mg daily. Repeat neuropsych evaluation for disease trajectory Recommend good control of cardiovascular risk factors Continue to control mood as per PCP    Subjective:   This patient is accompanied in the office by his wife  who supplements the history. Previous records as well as any outside records available were reviewed prior to todays visit.   Patient was last seen on 07/07/2022 with an MMSE of 24/30.    Any changes in memory since last visit? " Some days better than others".  His main concern is difficulty remembering dates and names of people that he meets.  Long-term memory is good. repeats oneself?  Endorsed.  "Not all the time". Disoriented when walking into a room?  Patient denies.    Misplacing objects?  Patient denies   Wandering behavior?   denies   Any personality changes since last visit?  Denies. "He is actually sweeter, more humble"-wife says.  Any worsening depression?: denies.   Hallucinations or paranoia?  Denies.   Seizures?   denies    Any sleep changes? Sleeps well . He talks a lot during the sleep. Denies vivid dreams, denies other REM behavior or sleepwalking   Sleep apnea?    Denies.    Any hygiene concerns?   Denies.   Independent of bathing and dressing?  Endorsed  Does the patient needs help with medications?  Wife is in charge   Who is in charge of the finances?  Wife is in charge     Any changes in appetite?  denies.  He admits to not drinking enough water.    Patient have trouble swallowing?  denies   Does the patient cook?  No.    Any headaches?    Intermittent, takes tylenol.   Vision changes? Denies. Chronic pain?  Denies.   Ambulates with difficulty?    Denies. Recent falls or head injuries? Denies      Unilateral weakness, numbness or tingling?   Denies.   Any tremors?  Any mild intention tremors, unchanged from prior visits. Any anosmia?  "I never did" Any incontinence of urine?  Denies.   Any bowel dysfunction?  Denies.      Patient lives with his wife.  Does the patient drive?  Yes, he denies any issues, denies getting lost.    Neuropsychological testing on 11/21/2021. Briefly, results suggested a fairly isolated impairment surrounding all aspects of verbal learning and memory. Broadly speaking, performances were appropriate relative to age-matched peers and premorbid intellectual estimations across all other assessed cognitive domains. The etiology for ongoing memory impairment is unclear at the present time. Across verbal memory measures, Charles Carr showed minimal benefit from repeated exposure to information, had  difficulty recalling previously learned information after brief delays, and performed poorly across yes/no recognition tasks. Retention rates across verbal memory tasks ranged from 0% to 50%. Taken together, this suggests some concern surrounding rapid forgetting and an evolving memory storage impairment, both of which can be concerning signs for Alzheimer's disease. However, if this illness is indeed present, it would appear to be in extremely early stages given that visual memory remained intact, as well as all other cognitive domains.  Time will need to pass before we can more definitively comment on concerns for Alzheimer's disease and the risk for progressive cognitive decline.      Initial Visit 1/30/23The patient is seen in neurologic consultation at the request of Charles Morales, NP for the evaluation of memory.  The patient is accompanied by his wife who supplements the history. This is a 72 y.o. year old RH  male who has had memory issues for about 2 years, when he began noticing he could not remember what his wife had told him in the recent conversation.  He also began to forget his on passwords, symptoms that had been worsening over the last year.  He initially visited his PCP in August 2021 with these complaints, had an MRI of the brain which was showing mild volume changes, otherwise negative.  His PCP placed him on donepezil 5 mg daily in March 2022, but as his memory changes were more evident, as of December 2022 this was increased to 10 mg daily.  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  Despite cauterization, he continued to have problems with short-term memory.  He denies leaving objects in unusual places.  He continues to drive and denies getting lost.  He denies any mood changes or depression.  He does have a history of GAD, well controlled with antianxiety medications.  He is not very active, never exercised much but recently he got a dog, and he is walking on a daily basis.  He does not like to read, do crossword puzzles or word finding.  He sleeps well.  His wife reports that for the last 10 years, he has been acting out in his dreams, at times moving his hands, at one point it was worse, "is better now since the pill ".  He denies hallucinations or paranoia.  There are no hygiene concerns, he is independent of bathing and dressing.  He is in charge of the medications, and denies missing any  doses.  He is also in charge of the bills, and denies missing any payments.  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  His appetite is not as good as it was before according to him.  He denies trouble swallowing.  He does not cook.  He ambulates without difficulty, denies any falls or ever having a head injury.  He has intermittent, chronic frontal headaches, about twice a week, takes Tylenol with resolution of it.  No photophobia, nausea or vomiting with it.  He denies double vision, dizziness, focal numbness, tingling, unilateral weakness, or anosmia.  He had COVID in March 2022.  He has mild right intention tremors, present for several years.  No history of seizures.  He denies  urine incontinence, retention, constipation or diarrhea.  He denies sleep apnea, alcohol or tobacco history.  Family history remarkable for mother and father with Alzheimer's disease.   MRI of the brain on 10/14/2019 was remarkable for no acute findings and mild age related volume loss. Single punctate T2 and FLAIR bright focus within the left frontal subcortical white matter, not likely significant and less than often seen at this age.   Labs 01/18/2021 remarkable for hemoglobin 11.5, hematocrit 37.4, MCV is low at 76.8 suspicious for microcytic anemia  Past Medical History:  Diagnosis Date   GAVE (gastric antral vascular ectasia) 07/02/2017   GERD (gastroesophageal reflux disease)    History of colonic polyps 07/02/2017   Hyperlipidemia    Iron deficiency anemia 07/02/2017   Mild cognitive impairment with memory loss 11/21/2021   Personal history of kidney stones 07/02/2017     Past Surgical History:  Procedure Laterality Date   APPENDECTOMY     COLONOSCOPY     ESOPHAGOGASTRODUODENOSCOPY (EGD) WITH PROPOFOL N/A 02/26/2014   Procedure:  ESOPHAGOGASTRODUODENOSCOPY (EGD) WITH PROPOFOL;  Surgeon: Rachael Fee, MD;  Location: [facility];  Service: Endoscopy;  Laterality: N/A;  APC   ESOPHAGOGASTRODUODENOSCOPY (EGD) WITH PROPOFOL N/A 04/16/2014   Procedure: ESOPHAGOGASTRODUODENOSCOPY (EGD) WITH PROPOFOL;  Surgeon: Rachael Fee, MD;  Location: [facility];  Service: Endoscopy;  Laterality: N/A;   ESOPHAGOGASTRODUODENOSCOPY (EGD) WITH PROPOFOL N/A 07/26/2017   Procedure: ESOPHAGOGASTRODUODENOSCOPY (EGD) WITH PROPOFOL;  Surgeon: Rachael Fee, MD;  Location: [facility];  Service: Endoscopy;  Laterality: N/A;   ESOPHAGOGASTRODUODENOSCOPY (EGD) WITH PROPOFOL N/A 02/17/2021   Procedure: ESOPHAGOGASTRODUODENOSCOPY (EGD) WITH PROPOFOL;  Surgeon: Rachael Fee, MD;  Location: [facility];  Service: Endoscopy;  Laterality: N/A;   ESOPHAGOGASTRODUODENOSCOPY ENDOSCOPY     [facility]   HOT HEMOSTASIS N/A 02/26/2014   Procedure: HOT HEMOSTASIS (ARGON PLASMA COAGULATION/BICAP);  Surgeon: Rachael Fee, MD;  Location: [facility];  Service: Endoscopy;  Laterality: N/A;   HOT HEMOSTASIS N/A 07/26/2017   Procedure: HOT HEMOSTASIS (ARGON PLASMA COAGULATION/BICAP);  Surgeon: Rachael Fee, MD;  Location: [facility];  Service: Endoscopy;  Laterality: N/A;   HOT HEMOSTASIS N/A 02/17/2021   Procedure: HOT HEMOSTASIS (ARGON PLASMA COAGULATION/BICAP);  Surgeon: Rachael Fee, MD;  Location: [facility];  Service: Endoscopy;  Laterality: N/A;   KIDNEY STONE SURGERY     x2   TONSILLECTOMY     UPPER GASTROINTESTINAL ENDOSCOPY       PREVIOUS MEDICATIONS:   CURRENT MEDICATIONS:  Outpatient Encounter Medications as of 12/29/2022  Medication Sig   acetaminophen (TYLENOL) 500 MG tablet Take 1,000 mg by mouth every 6 (six) hours as needed (pain).   escitalopram (LEXAPRO) 10 MG tablet Take by mouth.   ferrous sulfate 325 (65 FE) MG tablet Take 325 mg by mouth in the morning.   omeprazole (PRILOSEC) 40 MG capsule Take at least 30  minutes before breakfast on an empty stomach   rosuvastatin (CRESTOR) 5 MG tablet Take 5 mg by mouth in the morning.   [DISCONTINUED] donepezil (ARICEPT) 23 MG TABS tablet Take 1 tablet (23 mg total) by mouth at bedtime.   donepezil (ARICEPT) 23 MG TABS tablet Take 1 tablet (23 mg total) by mouth at bedtime.   No facility-administered encounter medications on file as of 12/29/2022.     Objective:     PHYSICAL EXAMINATION:    VITALS:   Vitals:   12/29/22 0851  BP: (!) 158/84  Pulse: 61  Resp: 20  SpO2: 98%  Height: 5\' 8"  (1.727 m)    GEN:  The patient appears stated age and is in NAD. HEENT:  Normocephalic, atraumatic.   Neurological examination:  General: NAD, well-groomed, appears stated age. Orientation: The patient is alert. Oriented to person, place and date Cranial nerves: There is good facial symmetry.The speech is fluent and clear. No aphasia or dysarthria. Fund of knowledge is appropriate. Recent memory impaired and remote memory is normal.  Attention and concentration are normal.  Able to name objects and repeat phrases.  Hearing is intact to conversational tone.   Delayed recall 0/3 Sensation: Sensation is intact to light touch throughout Motor: Strength is at least antigravity x4. DTR's 2/4 in UE/LE      03/14/2021   10:00 AM  Montreal Cognitive Assessment   Visuospatial/ Executive (0/5) 2  Naming (0/3) 3  Attention: Read list of digits (0/2) 2  Attention: Read list of letters (0/1) 1  Attention: Serial 7 subtraction starting at 100 (0/3) 2  Language: Repeat phrase (0/2) 1  Language : Fluency (0/1) 0  Abstraction (0/2) 1  Delayed Recall (0/5) 0  Orientation (0/6) 4  Total 16  Adjusted Score (based on education) 17       12/30/2022    4:00 PM 07/07/2022   12:00 PM 06/14/2021    1:00 PM  MMSE - Mini Mental State Exam  Orientation to time 5 2 5   Orientation to Place 5 5 5   Registration 3 3 3   Attention/ Calculation 5 4 3   Recall 0 1 3  Language-  name 2 objects 2 2 2   Language- repeat 1 1 1   Language- follow 3 step command 3 3 3   Language- read & follow direction 1 1 1   Write a sentence 1 1 1   Copy design 1 1 1   Total score 27 24 28        Movement examination: Tone: There is normal tone in the UE/LE. No cogwheeling. Abnormal movements:  minimal intention tremor bilaterally.  No myoclonus.  No asterixis.   Coordination:  There is no decremation with RAM's. Normal finger to nose  Gait and Station: The patient has no difficulty arising out of a deep-seated chair without the use of the hands. [name] stride length is good.  Gait is cautious and narrow.   Thank you for allowing [place] the opportunity to participate in the care of this nice patient. Please do not hesitate to contact us for any questions or concerns.   Total time spent on today's visit was 34 minutes dedicated to this patient today, preparing to see patient, examining the patient, ordering tests and/or medications and counseling the patient, documenting clinical information in the EHR or other health record, independently interpreting results and communicating results to the patient/family, discussing treatment and goals, answering patient's questions and coordinating care.  Cc:  Jerrye Bushy, FNP  Marlowe Kays 12/30/2022 4:43 PM

## 2022-12-29 NOTE — Patient Instructions (Addendum)
It was a pleasure to see you today at our office.   Recommendations:  Follow up in  6 months Repeat neuropsych  donepezil 23 mg daily. Side effects were discussed    RECOMMENDATIONS FOR ALL PATIENTS WITH MEMORY PROBLEMS: 1. Continue to exercise (Recommend 30 minutes of walking everyday, or 3 hours every week) 2. Increase social interactions - continue going to [place] and enjoy social gatherings with friends and family 3. Eat healthy, avoid fried foods and eat more fruits and vegetables 4. Maintain adequate blood pressure, blood sugar, and blood cholesterol level. Reducing the risk of stroke and cardiovascular disease also helps promoting better memory. 5. Avoid stressful situations. Live a simple life and avoid aggravations. Organize your time and prepare for the next day in anticipation. 6. Sleep well, avoid any interruptions of sleep and avoid any distractions in the bedroom that may interfere with adequate sleep quality 7. Avoid sugar, avoid sweets as there is a strong link between excessive sugar intake, diabetes, and cognitive impairment We discussed the Mediterranean diet, which has been shown to help patients reduce the risk of progressive memory disorders and reduces cardiovascular risk. This includes eating fish, eat fruits and green leafy vegetables, nuts like almonds and hazelnuts, walnuts, and also use olive oil. Avoid fast foods and fried foods as much as possible. Avoid sweets and sugar as sugar use has been linked to worsening of memory function.  There is always a concern of gradual progression of memory problems. If this is the case, then we may need to adjust level of care according to patient needs. Support, both to the patient and caregiver, should then be put into place.    FALL PRECAUTIONS: Be cautious when walking. Scan the area for obstacles that may increase the risk of trips and falls. When getting up in the mornings, sit up at the edge of the bed for a few minutes  before getting out of bed. Consider elevating the bed at the head end to avoid drop of blood pressure when getting up. Walk always in a well-lit room (use night lights in the walls). Avoid area rugs or power cords from appliances in the middle of the walkways. Use a walker or a cane if necessary and consider physical therapy for balance exercise. Get your eyesight checked regularly.  FINANCIAL OVERSIGHT: Supervision, especially oversight when making financial decisions or transactions is also recommended.  HOME SAFETY: Consider the safety of the kitchen when operating appliances like stoves, microwave oven, and blender. Consider having supervision and share cooking responsibilities until no longer able to participate in those. Accidents with firearms and other hazards in the house should be identified and addressed as well.   ABILITY TO BE LEFT ALONE: If patient is unable to contact 911 operator, consider using LifeLine, or when the need is there, arrange for someone to stay with patients. Smoking is a fire hazard, consider supervision or cessation. Risk of wandering should be assessed by caregiver and if detected at any point, supervision and safe proof recommendations should be instituted.  MEDICATION SUPERVISION: Inability to self-administer medication needs to be constantly addressed. Implement a mechanism to ensure safe administration of the medications.   DRIVING: Regarding driving, in patients with progressive memory problems, driving will be impaired. We advise to have someone else do the driving if trouble finding directions or if minor accidents are reported. Independent driving assessment is available to determine safety of driving.   If you are interested in the driving assessment, you can contact  the following:  [employer] in [place] [phone]  [facility] [phone]  [facility] [phone]  [facility] [phone] or  [phone]  To help prevent Alzheimer's dementia: 1.  Proper sleep (8 hours a night) 2.  Routine Exercise (at least 10,000 steps per day) 3.  Socialize.  Interact with others.  Meet friends for lunch.  Visit family. 4.  Learn new things:  Read a book about a topic that interests you, watch a documentary, read the newspaper, talk to friends about different topics. 5.  Mediterranean diet  Mediterranean Diet A Mediterranean diet refers to food and lifestyle choices that are based on the traditions of countries located on the [employer]. This way of eating has been shown to help prevent certain conditions and improve outcomes for people who have chronic diseases, like kidney disease and heart disease. What are tips for following this plan? Lifestyle  Cook and eat meals together with your family, when possible. Drink enough fluid to keep your urine clear or pale yellow. Be physically active every day. This includes: Aerobic exercise like running or swimming. Leisure activities like gardening, walking, or housework. Get 7-8 hours of sleep each night. If recommended by your health care provider, drink red wine in moderation. This means 1 glass a day for nonpregnant women and 2 glasses a day for men. A glass of wine equals 5 oz (150 mL). Reading food labels  Check the serving size of packaged foods. For foods such as rice and pasta, the serving size refers to the amount of cooked product, not dry. Check the total fat in packaged foods. Avoid foods that have saturated fat or trans fats. Check the ingredients list for added sugars, such as corn syrup. Shopping  At the grocery store, buy most of your food from the areas near the walls of the store. This includes: Fresh fruits and vegetables (produce). Grains, beans, nuts, and seeds. Some of these may be available in unpackaged forms or large amounts (in bulk). Fresh seafood. Poultry and eggs. Low-fat dairy products. Buy whole ingredients  instead of prepackaged foods. Buy fresh fruits and vegetables in-season from local farmers markets. Buy frozen fruits and vegetables in resealable bags. If you do not have access to quality fresh seafood, buy precooked frozen shrimp or canned fish, such as tuna, salmon, or sardines. Buy small amounts of raw or cooked vegetables, salads, or olives from the deli or salad bar at your store. Stock your pantry so you always have certain foods on hand, such as olive oil, canned tuna, canned tomatoes, rice, pasta, and beans. Cooking  Cook foods with extra-virgin olive oil instead of using butter or other vegetable oils. Have meat as a side dish, and have vegetables or grains as your main dish. This means having meat in small portions or adding small amounts of meat to foods like pasta or stew. Use beans or vegetables instead of meat in common dishes like chili or lasagna. Experiment with different cooking methods. Try roasting or broiling vegetables instead of steaming or sauteing them. Add frozen vegetables to soups, stews, pasta, or rice. Add nuts or seeds for added healthy fat at each meal. You can add these to yogurt, salads, or vegetable dishes. Marinate fish or vegetables using olive oil, lemon juice, garlic, and fresh herbs. Meal planning  Plan to eat 1 vegetarian meal one day each week. Try to work up to 2 vegetarian meals, if possible. Eat seafood 2 or more times a week.  Have healthy snacks readily available, such as: Vegetable sticks with hummus. Greek yogurt. Fruit and nut trail mix. Eat balanced meals throughout the week. This includes: Fruit: 2-3 servings a day Vegetables: 4-5 servings a day Low-fat dairy: 2 servings a day Fish, poultry, or lean meat: 1 serving a day Beans and legumes: 2 or more servings a week Nuts and seeds: 1-2 servings a day Whole grains: 6-8 servings a day Extra-virgin olive oil: 3-4 servings a day Limit red meat and sweets to only a few servings a  month What are my food choices? Mediterranean diet Recommended Grains: Whole-grain pasta. Brown rice. Bulgar wheat. Polenta. Couscous. Whole-wheat bread. Orpah Cobb. Vegetables: Artichokes. Beets. Broccoli. Cabbage. Carrots. Eggplant. Green beans. Chard. Kale. Spinach. Onions. Leeks. Peas. Squash. Tomatoes. Peppers. Radishes. Fruits: Apples. Apricots. Avocado. Berries. Bananas. Cherries. Dates. Figs. Grapes. Lemons. Melon. Oranges. Peaches. Plums. Pomegranate. Meats and other protein foods: Beans. Almonds. Sunflower seeds. Pine nuts. Peanuts. Cod. Salmon. Scallops. Shrimp. Tuna. Tilapia. Clams. Oysters. Eggs. Dairy: Low-fat milk. Cheese. Greek yogurt. Beverages: Water. Red wine. Herbal tea. Fats and oils: Extra virgin olive oil. Avocado oil. Grape seed oil. Sweets and desserts: [place] yogurt with honey. Baked apples. Poached pears. Trail mix. Seasoning and other foods: Basil. Cilantro. Coriander. Cumin. Mint. Parsley. Sage. Rosemary. Tarragon. Garlic. Oregano. Thyme. Pepper. Balsalmic vinegar. Tahini. Hummus. Tomato sauce. Olives. Mushrooms. Limit these Grains: Prepackaged pasta or rice dishes. Prepackaged cereal with added sugar. Vegetables: Deep fried potatoes (french fries). Fruits: Fruit canned in syrup. Meats and other protein foods: Beef. Pork. Lamb. Poultry with skin. Hot dogs. Tomasa Blase. Dairy: Ice cream. Sour cream. Whole milk. Beverages: Juice. Sugar-sweetened soft drinks. Beer. Liquor and spirits. Fats and oils: Butter. Canola oil. Vegetable oil. Beef fat (tallow). Lard. Sweets and desserts: Cookies. Cakes. Pies. Candy. Seasoning and other foods: Mayonnaise. Premade sauces and marinades. The items listed may not be a complete list. Talk with your dietitian about what dietary choices are right for you. Summary The Mediterranean diet includes both food and lifestyle choices. Eat a variety of fresh fruits and vegetables, beans, nuts, seeds, and whole grains. Limit the amount of red  meat and sweets that you eat. Talk with your health care provider about whether it is safe for you to drink red wine in moderation. This means 1 glass a day for nonpregnant women and 2 glasses a day for men. A glass of wine equals 5 oz (150 mL). This information is not intended to replace advice given to you by your health care provider. Make sure you discuss any questions you have with your health care provider. Document Released: 09/23/2015 Document Revised: 10/26/2015 Document Reviewed: 09/23/2015 Elsevier Interactive Patient Education  2017 [employer]     Lumbar Puncture will be at [facility] [phone]

## 2023-04-17 ENCOUNTER — Other Ambulatory Visit: Payer: Self-pay

## 2023-06-15 ENCOUNTER — Other Ambulatory Visit: Payer: Self-pay

## 2023-06-15 MED ORDER — OMEPRAZOLE 40 MG PO CPDR: DELAYED_RELEASE_CAPSULE | ORAL | 6 refills | Status: AC

## 2023-06-28 ENCOUNTER — Encounter: Payer: Self-pay | Admitting: Physician Assistant

## 2023-06-28 ENCOUNTER — Ambulatory Visit: Payer: Medicare HMO | Admitting: Physician Assistant

## 2023-06-28 VITALS — BP 154/68 | HR 60 | Resp 20 | Ht 68.0 in

## 2023-06-28 DIAGNOSIS — R413 Other amnesia: Secondary | ICD-10-CM

## 2023-06-28 DIAGNOSIS — G44209 Tension-type headache, unspecified, not intractable: Secondary | ICD-10-CM | POA: Diagnosis not present

## 2023-06-28 DIAGNOSIS — G3184 Mild cognitive impairment, so stated: Secondary | ICD-10-CM | POA: Diagnosis not present

## 2023-06-28 MED ORDER — DONEPEZIL HCL 23 MG PO TABS: 23.0000 mg | ORAL_TABLET | Freq: Every day | ORAL | 3 refills | Status: AC

## 2023-06-28 MED ORDER — MEMANTINE HCL 5 MG PO TABS: ORAL_TABLET | ORAL | 3 refills | Status: DC

## 2023-06-28 NOTE — Patient Instructions (Addendum)
 It was a pleasure to see you today at our office.   Recommendations:  Neurocognitive evaluation at our office   MRI of the brain, the radiology office will call you to arrange you appointment  [phone] Start Memantine 5mg  tablets.  Take 1 tablet at bedtime for 2 weeks, then 1 tablet twice daily.   Continue donepezil  23 mg daily. Side effects were discussed  Follow up in 6 months Recommend visiting the website : " Dementia [facility]" to better understand some behaviors related to memory loss.    For psychiatric meds, mood meds: Please have your primary care physician manage these medications.  If you have any severe symptoms of a stroke, or other severe issues such as confusion,severe chills or fever, etc call 911 or go to the ER as you may need to be evaluated further   For guidance regarding [facility] and if placement were needed at the facility, contact Social Worker tel: [phone]  For assessment of decision of mental capacity and competency:  Call Dr. Laverne Potter, geriatric psychiatrist at [phone]  Counseling regarding caregiver distress, including caregiver depression, anxiety and issues regarding community resources, adult day care programs, adult living facilities, or memory care questions:  please contact your  Primary Doctor's Social Worker   Whom to call: Memory  decline, memory medications: Call our office [phone]    [email or web address]   RECOMMENDATIONS FOR ALL PATIENTS WITH MEMORY PROBLEMS: 1. Continue to exercise (Recommend 30 minutes of walking everyday, or 3 hours every week) 2. Increase social interactions - continue going to [place] and enjoy social gatherings with friends and family 3. Eat healthy, avoid fried foods and eat more fruits and vegetables 4. Maintain adequate blood pressure, blood sugar, and blood cholesterol level. Reducing the risk of stroke and  cardiovascular disease also helps promoting better memory. 5. Avoid stressful situations. Live a simple life and avoid aggravations. Organize your time and prepare for the next day in anticipation. 6. Sleep well, avoid any interruptions of sleep and avoid any distractions in the bedroom that may interfere with adequate sleep quality 7. Avoid sugar, avoid sweets as there is a strong link between excessive sugar intake, diabetes, and cognitive impairment We discussed the Mediterranean diet, which has been shown to help patients reduce the risk of progressive memory disorders and reduces cardiovascular risk. This includes eating fish, eat fruits and green leafy vegetables, nuts like almonds and hazelnuts, walnuts, and also use olive oil. Avoid fast foods and fried foods as much as possible. Avoid sweets and sugar as sugar use has been linked to worsening of memory function.  There is always a concern of gradual progression of memory problems. If this is the case, then we may need to adjust level of care according to patient needs. Support, both to the patient and caregiver, should then be put into place.      You have been referred for a neuropsychological evaluation (i.e., evaluation of memory and thinking abilities). Please bring someone with you to this appointment if possible, as it is helpful for the doctor to hear from both you and another adult who knows you well. Please bring eyeglasses and hearing aids if you wear them.    The evaluation will take approximately 3 hours and has two parts:   The first part is a clinical interview with the neuropsychologist (Dr. Kitty Perkins or Dr. Donavon Fudge). During the interview, the neuropsychologist will speak with you and the individual you brought to the appointment.  The second part of the evaluation is testing with the doctor's technician Bernabe Brew or Burdette Carolin). During the testing, the technician will ask you to remember different types of material, solve problems, and  answer some questionnaires. Your family member will not be present for this portion of the evaluation.   Please note: We must reserve several hours of the neuropsychologist's time and the psychometrician's time for your evaluation appointment. As such, there is a No-Show fee of $100. If you are unable to attend any of your appointments, please contact our office as soon as possible to reschedule.      DRIVING: Regarding driving, in patients with progressive memory problems, driving will be impaired. We advise to have someone else do the driving if trouble finding directions or if minor accidents are reported. Independent driving assessment is available to determine safety of driving.   If you are interested in the driving assessment, you can contact the following:  [facility] in [place] [phone]  [facility] [phone]  [facility] [phone]  [facility] [phone] or [phone]   FALL PRECAUTIONS: Be cautious when walking. Scan the area for obstacles that may increase the risk of trips and falls. When getting up in the mornings, sit up at the edge of the bed for a few minutes before getting out of bed. Consider elevating the bed at the head end to avoid drop of blood pressure when getting up. Walk always in a well-lit room (use night lights in the walls). Avoid area rugs or power cords from appliances in the middle of the walkways. Use a walker or a cane if necessary and consider physical therapy for balance exercise. Get your eyesight checked regularly.  FINANCIAL OVERSIGHT: Supervision, especially oversight when making financial decisions or transactions is also recommended.  HOME SAFETY: Consider the safety of the kitchen when operating appliances like stoves, microwave oven, and blender. Consider having supervision and share cooking responsibilities until no longer able to participate in those. Accidents with firearms and  other hazards in the house should be identified and addressed as well.   ABILITY TO BE LEFT ALONE: If patient is unable to contact 911 operator, consider using LifeLine, or when the need is there, arrange for someone to stay with patients. Smoking is a fire hazard, consider supervision or cessation. Risk of wandering should be assessed by caregiver and if detected at any point, supervision and safe proof recommendations should be instituted.  MEDICATION SUPERVISION: Inability to self-administer medication needs to be constantly addressed. Implement a mechanism to ensure safe administration of the medications.      Mediterranean Diet A Mediterranean diet refers to food and lifestyle choices that are based on the traditions of countries located on the [employer]. This way of eating has been shown to help prevent certain conditions and improve outcomes for people who have chronic diseases, like kidney disease and heart disease. What are tips for following this plan? Lifestyle  Cook and eat meals together with your family, when possible. Drink enough fluid to keep your urine clear or pale yellow. Be physically active every day. This includes: Aerobic exercise like running or swimming. Leisure activities like gardening, walking, or housework. Get 7-8 hours of sleep each night. If recommended by your health care provider, drink red wine in moderation. This means 1 glass a day for nonpregnant women and 2 glasses a day for men. A glass of wine equals 5 oz (150 mL). Reading food labels  Check the serving size  of packaged foods. For foods such as rice and pasta, the serving size refers to the amount of cooked product, not dry. Check the total fat in packaged foods. Avoid foods that have saturated fat or trans fats. Check the ingredients list for added sugars, such as corn syrup. Shopping  At the grocery store, buy most of your food from the areas near the walls of the store. This  includes: Fresh fruits and vegetables (produce). Grains, beans, nuts, and seeds. Some of these may be available in unpackaged forms or large amounts (in bulk). Fresh seafood. Poultry and eggs. Low-fat dairy products. Buy whole ingredients instead of prepackaged foods. Buy fresh fruits and vegetables in-season from local farmers markets. Buy frozen fruits and vegetables in resealable bags. If you do not have access to quality fresh seafood, buy precooked frozen shrimp or canned fish, such as tuna, salmon, or sardines. Buy small amounts of raw or cooked vegetables, salads, or olives from the deli or salad bar at your store. Stock your pantry so you always have certain foods on hand, such as olive oil, canned tuna, canned tomatoes, rice, pasta, and beans. Cooking  Cook foods with extra-virgin olive oil instead of using butter or other vegetable oils. Have meat as a side dish, and have vegetables or grains as your main dish. This means having meat in small portions or adding small amounts of meat to foods like pasta or stew. Use beans or vegetables instead of meat in common dishes like chili or lasagna. Experiment with different cooking methods. Try roasting or broiling vegetables instead of steaming or sauteing them. Add frozen vegetables to soups, stews, pasta, or rice. Add nuts or seeds for added healthy fat at each meal. You can add these to yogurt, salads, or vegetable dishes. Marinate fish or vegetables using olive oil, lemon juice, garlic, and fresh herbs. Meal planning  Plan to eat 1 vegetarian meal one day each week. Try to work up to 2 vegetarian meals, if possible. Eat seafood 2 or more times a week. Have healthy snacks readily available, such as: Vegetable sticks with hummus. Greek yogurt. Fruit and nut trail mix. Eat balanced meals throughout the week. This includes: Fruit: 2-3 servings a day Vegetables: 4-5 servings a day Low-fat dairy: 2 servings a day Fish, poultry, or  lean meat: 1 serving a day Beans and legumes: 2 or more servings a week Nuts and seeds: 1-2 servings a day Whole grains: 6-8 servings a day Extra-virgin olive oil: 3-4 servings a day Limit red meat and sweets to only a few servings a month What are my food choices? Mediterranean diet Recommended Grains: Whole-grain pasta. Brown rice. Bulgar wheat. Polenta. Couscous. Whole-wheat bread. Dwyane Glad. Vegetables: Artichokes. Beets. Broccoli. Cabbage. Carrots. Eggplant. Green beans. Chard. Kale. Spinach. Onions. Leeks. Peas. Squash. Tomatoes. Peppers. Radishes. Fruits: Apples. Apricots. Avocado. Berries. Bananas. Cherries. Dates. Figs. Grapes. Lemons. Melon. Oranges. Peaches. Plums. Pomegranate. Meats and other protein foods: Beans. Almonds. Sunflower seeds. Pine nuts. Peanuts. Cod. Salmon. Scallops. Shrimp. Tuna. Tilapia. Clams. Oysters. Eggs. Dairy: Low-fat milk. Cheese. Greek yogurt. Beverages: Water. Red wine. Herbal tea. Fats and oils: Extra virgin olive oil. Avocado oil. Grape seed oil. Sweets and desserts: [place] yogurt with honey. Baked apples. Poached pears. Trail mix. Seasoning and other foods: Basil. Cilantro. Coriander. Cumin. Mint. Parsley. Sage. Rosemary. Tarragon. Garlic. Oregano. Thyme. Pepper. Balsalmic vinegar. Tahini. Hummus. Tomato sauce. Olives. Mushrooms. Limit these Grains: Prepackaged pasta or rice dishes. Prepackaged cereal with added sugar. Vegetables: Deep fried potatoes (french fries).  Fruits: Fruit canned in syrup. Meats and other protein foods: Beef. Pork. Lamb. Poultry with skin. Hot dogs. Helene Loader. Dairy: Ice cream. Sour cream. Whole milk. Beverages: Juice. Sugar-sweetened soft drinks. Beer. Liquor and spirits. Fats and oils: Butter. Canola oil. Vegetable oil. Beef fat (tallow). Lard. Sweets and desserts: Cookies. Cakes. Pies. Candy. Seasoning and other foods: Mayonnaise. Premade sauces and marinades. The items listed may not be a complete list. Talk with your  dietitian about what dietary choices are right for you. Summary The Mediterranean diet includes both food and lifestyle choices. Eat a variety of fresh fruits and vegetables, beans, nuts, seeds, and whole grains. Limit the amount of red meat and sweets that you eat. Talk with your health care provider about whether it is safe for you to drink red wine in moderation. This means 1 glass a day for nonpregnant women and 2 glasses a day for men. A glass of wine equals 5 oz (150 mL). This information is not intended to replace advice given to you by your health care provider. Make sure you discuss any questions you have with your health care provider. Document Released: 09/23/2015 Document Revised: 10/26/2015 Document Reviewed: 09/23/2015 Elsevier Interactive Patient Education  2017 [employer].

## 2023-06-28 NOTE — Progress Notes (Signed)
 Assessment/Plan:   Mild cognitive impairment, concern for Alzheimer's Disease   Charles Carr is a very pleasant 73 y.o. 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 Patient is on donepezil  23 mg daily. MMSE today is 23/30, slight decline from prior. Patient is able to participate on ADLs and to drive without difficulties. Mood is stable.        Recommendations:   Follow up in 6 months. Continue donepezil  23 mg daily  Start Memantine 5 mg: Take 1 tablet (5 mg at night) for 2 weeks, then increase to 1 tablet (5 mg) twice a day, side effects discussed    Patient is scheduled for repeat neuropsych evaluation in the near future for diagnostic clarity and disease trajectory Repeat MRI brain prior to the future neuropsych evaluation (last in 2021) Recommend good control of cardiovascular risk factors Continue to control mood as per PCP    Subjective:   This patient is accompanied in the office by his wife who supplements the history. Previous records as well as any outside records available were reviewed prior to todays visit.  Patient was last seen on 12/29/2022 with MMSE 27/30     Any changes in memory since last visit? "I feel great". Wife is concerned about STM, especially with appointments He has difficulty remembering dates and names of people that he meets, and before.  Still able to remember conversations. long-term memory is good.  He likes doing house chores. He watches TV. repeats oneself?  Endorsed Disoriented when walking into a room?  Patient denies    Misplacing objects?  Patient denies.   Wandering behavior?   denies   Any personality changes since last visit?  Picked up a few  habits when he sleeps "he is like chewing gum". "He is sweet and humble" Any worsening depression?: denies   Hallucinations or paranoia?  denies   Seizures?   denies    Any sleep changes? Sleeps well, "he talks during the sleep sometimes ".  Denies vivid dreams, REM behavior or sleepwalking   Sleep apnea?   Denies.    Any hygiene concerns?   denies   Independent of bathing and dressing?  Endorsed  Does the patient needs help with medications?  Wife is in charge   Who is in charge of the finances?  Wife is in charge     Any changes in appetite?  denies.  He does not drink enough water    Patient have trouble swallowing?  denies   Does the patient cook? No  Any headaches?  Sometimes he has frontal headaches, tension type, takes tylenol  with good relief.    Vision changes? Denies. Chronic pain?  Denies.   Ambulates with difficulty?    Denies.    Recent falls or head injuries?    Denies.      Unilateral weakness, numbness or tingling?   denies   Any tremors?  Very mild intention tremors, unchanged from prior visits. Any anosmia? "I never was able to smell".  Any incontinence of urine?  denies   Any bowel dysfunction?  denies      Patient lives with his wife Does the patient drive?  Yes, he denies getting lost.   LP  in 07/2022, pTau/Abeta42 ratio was abnormal at 0.033, consistent with the presence of Alzheimer's disease     Neuropsychological testing on 11/21/2021. Briefly, results suggested a fairly isolated impairment surrounding all aspects of verbal learning and memory. Broadly speaking, performances were appropriate relative to age-matched peers and premorbid intellectual estimations across all other assessed cognitive domains. The etiology for ongoing memory impairment is unclear at the present time. Across verbal memory measures, Mr. Mangat showed minimal benefit from repeated exposure to information, had difficulty recalling previously learned information after brief delays, and  performed poorly across yes/no recognition tasks. Retention rates across verbal memory tasks ranged from 0% to 50%. Taken together, this suggests some concern surrounding rapid forgetting and an evolving memory storage impairment, both of which can be concerning signs for Alzheimer's disease. However, if this illness is indeed present, it would appear to be in extremely early stages given that visual memory remained intact, as well as all other cognitive domains. Time will need to pass before we can more definitively comment on concerns for Alzheimer's disease and the risk for progressive cognitive decline.      Initial Visit 03/14/21 The patient is seen in neurologic consultation at the request of Gunter, Tara G, NP for the evaluation of memory.  The patient is accompanied by his wife who supplements the history. This is a 73 y.o. year old RH  male who has had memory issues for about 2 years, when he began noticing he could not remember what his wife had told him in the recent conversation.  He also began to forget his on passwords, symptoms that had been worsening over the last year.  He initially visited his PCP in August 2021 with these complaints, had an MRI of the brain which was showing mild volume changes, otherwise negative.  His PCP placed him on donepezil  5 mg daily in March 2022, but as his memory changes were more evident, as of December 2022 this was increased to 10 mg daily.  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  Despite cauterization, he continued to have problems with short-term memory.  He denies leaving objects in unusual places.  He continues to drive and denies getting lost.  He denies any mood changes or depression.  He does have a history of GAD, well controlled with antianxiety medications.  He is not very active, never exercised much but recently he got  a dog, and he is walking on a daily basis.  He does not like to read, do crossword puzzles or word finding.  He sleeps well.  His wife reports that for the last 10 years, he has been acting out in his dreams, at times moving his hands, at one point it was worse, "is better now since the pill ".  He denies hallucinations or paranoia.  There are no hygiene concerns, he is independent of bathing and dressing.  He is in charge of the medications, and denies missing any doses.  He is also in charge of the bills, and denies missing any payments.  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  His appetite is not as good as it was before according to him.  He denies  trouble swallowing.  He does not cook.  He ambulates without difficulty, denies any falls or ever having a head injury.  He has intermittent, chronic frontal headaches, about twice a week, takes Tylenol  with resolution of it.  No photophobia, nausea or vomiting with it.  He denies double vision, dizziness, focal numbness, tingling, unilateral weakness, or anosmia.  He had COVID in March 2022.  He has mild right intention tremors, present for several years.  No history of seizures.  He denies urine incontinence, retention, constipation or diarrhea.  He denies sleep apnea, alcohol or tobacco history.  Family history remarkable for mother and father with Alzheimer's disease.   MRI of the brain on 10/14/2019 was remarkable for no acute findings and mild age related volume loss. Single punctate T2 and FLAIR bright focus within the left frontal subcortical white matter, not likely significant and less than often seen at this age.       Past Medical History:  Diagnosis Date   GAVE (gastric antral vascular ectasia) 07/02/2017   GERD (gastroesophageal reflux disease)    History of colonic polyps  07/02/2017   Hyperlipidemia    Iron deficiency anemia 07/02/2017   Mild cognitive impairment with memory loss 11/21/2021   Personal history of kidney stones 07/02/2017     Past Surgical History:  Procedure Laterality Date   APPENDECTOMY     COLONOSCOPY     ESOPHAGOGASTRODUODENOSCOPY (EGD) WITH PROPOFOL  N/A 02/26/2014   Procedure: ESOPHAGOGASTRODUODENOSCOPY (EGD) WITH PROPOFOL ;  Surgeon: Janel Medford, MD;  Location: [facility];  Service: Endoscopy;  Laterality: N/A;  APC   ESOPHAGOGASTRODUODENOSCOPY (EGD) WITH PROPOFOL  N/A 04/16/2014   Procedure: ESOPHAGOGASTRODUODENOSCOPY (EGD) WITH PROPOFOL ;  Surgeon: Janel Medford, MD;  Location: [facility];  Service: Endoscopy;  Laterality: N/A;   ESOPHAGOGASTRODUODENOSCOPY (EGD) WITH PROPOFOL  N/A 07/26/2017   Procedure: ESOPHAGOGASTRODUODENOSCOPY (EGD) WITH PROPOFOL ;  Surgeon: Janel Medford, MD;  Location: [facility];  Service: Endoscopy;  Laterality: N/A;   ESOPHAGOGASTRODUODENOSCOPY (EGD) WITH PROPOFOL  N/A 02/17/2021   Procedure: ESOPHAGOGASTRODUODENOSCOPY (EGD) WITH PROPOFOL ;  Surgeon: Janel Medford, MD;  Location: [facility];  Service: Endoscopy;  Laterality: N/A;   ESOPHAGOGASTRODUODENOSCOPY ENDOSCOPY     [facility]   HOT HEMOSTASIS N/A 02/26/2014   Procedure: HOT HEMOSTASIS (ARGON PLASMA COAGULATION/BICAP);  Surgeon: Janel Medford, MD;  Location: [facility];  Service: Endoscopy;  Laterality: N/A;   HOT HEMOSTASIS N/A 07/26/2017   Procedure: HOT HEMOSTASIS (ARGON PLASMA COAGULATION/BICAP);  Surgeon: Janel Medford, MD;  Location: [facility];  Service: Endoscopy;  Laterality: N/A;   HOT HEMOSTASIS N/A 02/17/2021   Procedure: HOT HEMOSTASIS (ARGON PLASMA COAGULATION/BICAP);  Surgeon: Janel Medford, MD;  Location: [facility];  Service: Endoscopy;  Laterality: N/A;   KIDNEY STONE SURGERY     x2   TONSILLECTOMY     UPPER GASTROINTESTINAL ENDOSCOPY       PREVIOUS MEDICATIONS:   CURRENT MEDICATIONS:  Outpatient  Encounter Medications as of 06/28/2023  Medication Sig   acetaminophen  (TYLENOL ) 500 MG tablet Take 1,000 mg by mouth every 6 (six) hours as needed (pain).   escitalopram (LEXAPRO) 10 MG tablet Take by mouth.   ferrous sulfate 325 (65 FE) MG tablet Take 325 mg by mouth in the morning.   memantine (NAMENDA) 5 MG tablet Take 1 tablet (5 mg at night) for 2 weeks, then increase to 1 tablet (5 mg) twice a day   omeprazole  (PRILOSEC) 40 MG capsule Take at least 30 minutes before breakfast on an empty stomach.  Patient needs follow up appointment for future refills. Please call [phone] to schedule an appointment.   rosuvastatin (CRESTOR) 5 MG tablet Take 5 mg by mouth in the morning.   [DISCONTINUED] donepezil  (ARICEPT ) 23 MG TABS tablet Take 1 tablet (23 mg total) by mouth at bedtime.   donepezil  (ARICEPT ) 23 MG TABS tablet Take 1 tablet (23 mg total) by mouth at bedtime.   No facility-administered encounter medications on file as of 06/28/2023.     Objective:     PHYSICAL EXAMINATION:    VITALS:   Vitals:   06/28/23 1056  BP: (!) 154/68  Pulse: 60  Resp: 20  SpO2: 99%  Height: 5\' 8"  (1.727 m)    GEN:  The patient appears stated age and is in NAD. HEENT:  Normocephalic, atraumatic.   Neurological examination:  General: NAD, well-groomed, appears stated age. Orientation: The patient is alert. Oriented to person, place and not to date Cranial nerves: There is good facial symmetry.The speech is fluent and clear. No aphasia or dysarthria. Fund of knowledge is appropriate. Recent and remote memory impaired.  Attention and concentration are normal.  Able to name objects and repeat phrases.  Hearing is intact to conversational tone.   Delayed recall 0/3 Sensation: Sensation is intact to light touch throughout Motor: Strength is at least antigravity x4. DTR's 2/4 in UE/LE      03/14/2021   10:00 AM  Montreal Cognitive Assessment   Visuospatial/ Executive (0/5) 2  Naming (0/3) 3   Attention: Read list of digits (0/2) 2  Attention: Read list of letters (0/1) 1  Attention: Serial 7 subtraction starting at 100 (0/3) 2  Language: Repeat phrase (0/2) 1  Language : Fluency (0/1) 0  Abstraction (0/2) 1  Delayed Recall (0/5) 0  Orientation (0/6) 4  Total 16  Adjusted Score (based on education) 17       06/28/2023    1:00 PM 12/30/2022    4:00 PM 07/07/2022   12:00 PM  MMSE - Mini Mental State Exam  Orientation to time 3 5 2   Orientation to Place 4 5 5   Registration 3 3 3   Attention/ Calculation 4 5 4   Recall 0 0 1  Language- name 2 objects 2 2 2   Language- repeat 1 1 1   Language- follow 3 step command 3 3 3   Language- read & follow direction 1 1 1   Write a sentence 1 1 1   Copy design 1 1 1   Total score 23 27 24        Movement examination: Tone: There is normal tone in the UE/LE Abnormal movements:  no tremor.  No myoclonus.  No asterixis.   Coordination:  There is no decremation with RAM's. Normal finger to nose  Gait and Station: The patient has no difficulty arising out of a deep-seated chair without the use of the hands. [name] stride length is good.  Gait is cautious and narrow.   Thank you for allowing us  the opportunity to participate in the care of this nice patient. Please do not hesitate to contact us  for any questions or concerns.   Total time spent on today's visit was 32 minutes dedicated to this patient today, preparing to see patient, examining the patient, ordering tests and/or medications and counseling the patient, documenting clinical information in the EHR or other health record, independently interpreting results and communicating results to the patient/family, discussing treatment and goals, answering patient's questions and coordinating care.  Cc:  Brown-Patram, Melissa Joyce, NP  Tex Filbert 06/28/2023 1:15 PM

## 2023-07-08 ENCOUNTER — Ambulatory Visit
Admission: RE | Admit: 2023-07-08 | Discharge: 2023-07-08 | Disposition: A | Discharge status: Home / Self Care | Source: Ambulatory Visit | Attending: Physician Assistant | Admitting: Physician Assistant

## 2023-07-23 ENCOUNTER — Ambulatory Visit: Payer: Self-pay | Admitting: Physician Assistant

## 2023-07-27 DIAGNOSIS — F419 Anxiety disorder, unspecified: Secondary | ICD-10-CM | POA: Insufficient documentation

## 2023-08-16 ENCOUNTER — Ambulatory Visit: Payer: Self-pay

## 2023-08-16 ENCOUNTER — Institutional Professional Consult (permissible substitution): Admitting: Psychology

## 2023-08-20 ENCOUNTER — Encounter: Payer: Self-pay | Admitting: Psychology

## 2023-08-20 DIAGNOSIS — E785 Hyperlipidemia, unspecified: Secondary | ICD-10-CM | POA: Insufficient documentation

## 2023-08-21 ENCOUNTER — Ambulatory Visit: Payer: Self-pay | Admitting: Psychology

## 2023-08-21 ENCOUNTER — Encounter: Payer: Self-pay | Admitting: Psychology

## 2023-08-21 ENCOUNTER — Ambulatory Visit: Admitting: Psychology

## 2023-08-21 DIAGNOSIS — R4189 Other symptoms and signs involving cognitive functions and awareness: Secondary | ICD-10-CM

## 2023-08-21 DIAGNOSIS — G309 Alzheimer's disease, unspecified: Secondary | ICD-10-CM | POA: Diagnosis not present

## 2023-08-21 DIAGNOSIS — F0280 Dementia in other diseases classified elsewhere without behavioral disturbance: Secondary | ICD-10-CM | POA: Insufficient documentation

## 2023-08-21 NOTE — Progress Notes (Signed)
   Psychometrician Note   Cognitive testing was administered to Charles Carr by Lonell Jude, B.S. (psychometrist) under the supervision of Dr. Zachary C. Merz, Ph.D., ABPP, licensed psychologist on 08/21/2023. Mr. Dilone did not appear overtly distressed by the testing session per behavioral observation or responses across self-report questionnaires. Rest breaks were offered.    The battery of tests administered was selected by Dr. Zachary C. Merz, Ph.D., ABPP with consideration to Mr. Utter's current level of functioning, the nature of his symptoms, emotional and behavioral responses during interview, level of literacy, observed level of motivation/effort, and the nature of the referral question. This battery was communicated to the psychometrist. Communication between Dr. Arthea KYM Maryland, Ph.D., ABPP and the psychometrist was ongoing throughout the evaluation and Dr. Arthea KYM Maryland, Ph.D., ABPP was immediately accessible at all times. Dr. Zachary C. Merz, Ph.D., ABPP provided supervision to the psychometrist on the date of this service to the extent necessary to assure the quality of all services provided.    ULYSESS WITZ will return within approximately 1-2 weeks for an interactive feedback session with Dr. Maryland at which time his test performances, clinical impressions, and treatment recommendations will be reviewed in detail. Mr. Pangilinan understands he can contact our office should he require our assistance before this time.  A total of 165 minutes of billable time were spent face-to-face with Mr. Flori by the psychometrist. This includes both test administration and scoring time. Billing for these services is reflected in the clinical report generated by Dr. Arthea KYM Maryland, Ph.D., ABPP  This note reflects time spent with the psychometrician and does not include test scores or any clinical interpretations made by Dr. Maryland. The full report will follow in a separate note.

## 2023-08-21 NOTE — Progress Notes (Addendum)
 NEUROPSYCHOLOGICAL EVALUATION [place] [place] Department of Neurology  Date of Evaluation: August 21, 2023  Reason for Referral:   Charles Carr is a 73 y.o. right-handed Caucasian male referred by Camie Sevin, PA-C, to characterize his current cognitive functioning and assist with diagnostic clarity and treatment planning in the context of a prior mild neurocognitive disorder diagnosis and concern for progressive cognitive decline.   Assessment and Plan:   Clinical Impression(s): Charles Carr' pattern of performance is suggestive of severe impairment surrounding all aspects of verbal learning and memory. Additional impairment was exhibited across executive functioning, receptive language, semantic fluency, confrontation naming, and clock drawing. Further variability was exhibited across processing speed and phonemic fluency. Performances were appropriate relative to age-matched peers across attention/concentration, non-clock drawing visuospatial abilities, and visual memory. Functionally, Charles Carr denied all concerns. However, he did acknowledge that his wife lays out his medications and I do have concern that he Charles not fully appreciate the extent of ongoing impairment. As such, I feel that he best meets diagnostic criteria for a Major Neurocognitive Disorder (dementia). If his report of minimal interference with daily functioning is accurate, he would be towards the mild end of this spectrum presently.   Relative to his previous evaluation in October 2023, greatest areas of decline were exhibited across confrontation naming, executive functioning, clock drawing, and semantic fluency. Receptive language and verbal memory (retention rates more specifically) also saw mild decline. Other assessed domains exhibited relative stability.   Regarding the cause for ongoing memory impairment, concerns remain for underlying Alzheimer's disease. Across memory testing, Charles Carr was  fully amnestic (i.e., 0% retention) across all verbal tasks after a brief delay. He also performed poorly across recognition trials, suggesting evidence for rapid forgetting and a prominent storage impairment, both of which are the hallmark testing patterns of this illness. Further impairments surrounding semantic fluency, confrontation naming, clock drawing, and executive functioning would follow the expected progression of this illness as it spreads. His lumbar puncture in July 2024 was concerning for underlying Alzheimer's disease pathology. Overall, the combination of testing patterns, progressive decline, and laboratory testing (lumbar puncture) does suggest underlying Alzheimer's disease driving memory impairment and cognitive decline being most likely. Continued medical monitoring will be important moving forward.   His more recent brain MRI in June 2025 suggested hippocampal encephalomalacia. The term encephalomalacia is admittedly a curious one as this term is typically associated with ischemia or other neurological damage rather than atrophy caused by Alzheimer's disease pathology. As this finding was exhibited across neuroimaging between his two evaluations, this does open the door for an ischemic event impacting the hippocampal regions being a direct cause of memory decline. If encephalomalacia is indeed the accurate clinical term, the cause for this imaging finding remains uncertain.   Recommendations: Charles Carr has already been prescribed medication aimed to address memory loss and concerns surrounding Alzheimer's disease (i.e., donepezil /Aricept  and memantine /Namenda ). He is encouraged to continue taking these medications as prescribed. It is important to highlight that these medications have been shown to slow functional decline in some individuals. There is no current treatment which can stop or reverse cognitive decline when caused by a neurodegenerative illness.   Performance across  neurocognitive testing is not a strong predictor of an individual's safety operating a motor vehicle. Should his family wish to pursue a formalized driving evaluation, they could reach out to the following agencies: [employer] in [place]: [phone] [facility]: [phone] [facility]: [phone]  [facility]: [phone] or [phone]  Should there be progression of current deficits over time, Charles Carr is unlikely to regain any independent living skills lost. Therefore, it is recommended that he remain as involved as possible in all aspects of household chores, finances, and medication management, with supervision to ensure adequate performance. He will likely benefit from the establishment and maintenance of a routine in order to maximize his functional abilities over time.  It will be important for Charles Carr to have another person with him when in situations where he Charles need to process information, weigh the pros and cons of different options, and make decisions, in order to ensure that he fully understands and recalls all information to be considered.  If not already done, Charles Carr and his family Charles want to discuss his wishes regarding durable power of attorney and medical decision making, so that he can have input into these choices. If they require legal assistance with this, long-term care resource access, or other aspects of estate planning, they could reach out to [facility] at [phone] for a free consultation. Additionally, they Charles wish to discuss future plans for caretaking and seek out community options for in home/residential care should they become necessary.  Charles Carr is encouraged to attend to lifestyle factors for brain health (e.g., regular physical exercise, good nutrition habits and consideration of the MIND-DASH diet, regular participation in cognitively-stimulating activities, and general stress management  techniques), which are likely to have benefits for both emotional adjustment and cognition. Optimal control of vascular risk factors (including safe cardiovascular exercise and adherence to dietary recommendations) is encouraged. Continued participation in activities which provide mental stimulation and social interaction is also recommended.   Important information should be provided to Charles Carr in written format in all instances. This information should be placed in a highly frequented and easily visible location within his home to promote recall. External strategies such as written notes in a consistently used memory journal, visual and nonverbal auditory cues such as a calendar on the refrigerator or appointments with alarm, such as on a cell phone, can also help maximize recall.  To address problems with processing speed, he Charles wish to consider:   -Ensuring that he is alerted when essential material or instructions are being presented   -Adjusting the speed at which new information is presented   -Allowing for more time in comprehending, processing, and responding in conversation   -Repeating and paraphrasing instructions or conversations aloud  To address problems with fluctuating attention and/or executive dysfunction, he Charles wish to consider:   -Avoiding external distractions when needing to concentrate   -Limiting exposure to fast paced environments with multiple sensory demands   -Writing down complicated information and using checklists   -Attempting and completing one task at a time (i.e., no multi-tasking)   -Verbalizing aloud each step of a task to maintain focus   -Taking frequent breaks during the completion of steps/tasks to avoid fatigue   -Reducing the amount of information considered at one time   -Scheduling more difficult activities for a time of day where he is usually most alert  Review of Records:   Charles Carr completed a comprehensive neuropsychological evaluation  with myself on 11/21/2021. Results suggested a fairly isolated impairment surrounding all aspects of verbal learning and memory. Broadly speaking, performances were appropriate relative to age-matched peers and premorbid intellectual estimations across all other assessed cognitive domains. This included processing speed, attention/concentration, executive functioning, safety/judgment, receptive and expressive language,  visuospatial abilities, and visual learning and memory. Charles Carr denied difficulties completing instrumental activities of daily living (ADLs) independently. As such, given evidence for cognitive dysfunction described above, he met criteria for a Mild Neurocognitive Disorder (mild cognitive impairment) at that time. Repeat testing in 18-24 months was recommended.   Past Medical History:  Diagnosis Date   Anxiety 07/27/2023   GAVE (gastric antral vascular ectasia) 07/02/2017   GERD (gastroesophageal reflux disease)    History of colonic polyps 07/02/2017   Hyperlipidemia    Iron deficiency anemia 07/02/2017   Mild cognitive impairment with memory loss 11/21/2021   Personal history of kidney stones 07/02/2017    Past Surgical History:  Procedure Laterality Date   APPENDECTOMY     COLONOSCOPY     ESOPHAGOGASTRODUODENOSCOPY (EGD) WITH PROPOFOL  N/A 02/26/2014   Procedure: ESOPHAGOGASTRODUODENOSCOPY (EGD) WITH PROPOFOL ;  Surgeon: Toribio SHAUNNA Cedar, MD;  Location: [facility];  Service: Endoscopy;  Laterality: N/A;  APC   ESOPHAGOGASTRODUODENOSCOPY (EGD) WITH PROPOFOL  N/A 04/16/2014   Procedure: ESOPHAGOGASTRODUODENOSCOPY (EGD) WITH PROPOFOL ;  Surgeon: Toribio SHAUNNA Cedar, MD;  Location: [facility];  Service: Endoscopy;  Laterality: N/A;   ESOPHAGOGASTRODUODENOSCOPY (EGD) WITH PROPOFOL  N/A 07/26/2017   Procedure: ESOPHAGOGASTRODUODENOSCOPY (EGD) WITH PROPOFOL ;  Surgeon: Cedar Toribio SHAUNNA, MD;  Location: [facility];  Service: Endoscopy;  Laterality: N/A;   ESOPHAGOGASTRODUODENOSCOPY (EGD)  WITH PROPOFOL  N/A 02/17/2021   Procedure: ESOPHAGOGASTRODUODENOSCOPY (EGD) WITH PROPOFOL ;  Surgeon: Cedar Toribio SHAUNNA, MD;  Location: [facility];  Service: Endoscopy;  Laterality: N/A;   ESOPHAGOGASTRODUODENOSCOPY ENDOSCOPY     [facility]   HOT HEMOSTASIS N/A 02/26/2014   Procedure: HOT HEMOSTASIS (ARGON PLASMA COAGULATION/BICAP);  Surgeon: Toribio SHAUNNA Cedar, MD;  Location: [facility];  Service: Endoscopy;  Laterality: N/A;   HOT HEMOSTASIS N/A 07/26/2017   Procedure: HOT HEMOSTASIS (ARGON PLASMA COAGULATION/BICAP);  Surgeon: Cedar Toribio SHAUNNA, MD;  Location: [facility];  Service: Endoscopy;  Laterality: N/A;   HOT HEMOSTASIS N/A 02/17/2021   Procedure: HOT HEMOSTASIS (ARGON PLASMA COAGULATION/BICAP);  Surgeon: Cedar Toribio SHAUNNA, MD;  Location: [facility];  Service: Endoscopy;  Laterality: N/A;   KIDNEY STONE SURGERY     x2   TONSILLECTOMY     UPPER GASTROINTESTINAL ENDOSCOPY      Current Outpatient Medications:    acetaminophen  (TYLENOL ) 500 MG tablet, Take 1,000 mg by mouth every 6 (six) hours as needed (pain)., Disp: , Rfl:    donepezil  (ARICEPT ) 23 MG TABS tablet, Take 1 tablet (23 mg total) by mouth at bedtime., Disp: 90 tablet, Rfl: 3   escitalopram (LEXAPRO) 10 MG tablet, Take by mouth., Disp: , Rfl:    ferrous sulfate 325 (65 FE) MG tablet, Take 325 mg by mouth in the morning., Disp: , Rfl:    memantine  (NAMENDA ) 5 MG tablet, Take 1 tablet (5 mg at night) for 2 weeks, then increase to 1 tablet (5 mg) twice a day, Disp: 180 tablet, Rfl: 3   omeprazole  (PRILOSEC) 40 MG capsule, Take at least 30 minutes before breakfast on an empty stomach.  Patient needs follow up appointment for future refills. Please call [phone] to schedule an appointment., Disp: 30 capsule, Rfl: 6   rosuvastatin (CRESTOR) 5 MG tablet, Take 5 mg by mouth in the morning., Disp: , Rfl:      06/28/2023    1:00 PM 12/30/2022    4:00 PM 07/07/2022   12:00 PM 06/14/2021    1:00 PM  MMSE - Mini Mental State Exam   Orientation to time 3 5 2  5  Orientation to Place 4 5 5 5   Registration 3 3 3 3   Attention/ Calculation 4 5 4 3   Recall 0 0 1 3  Language- name 2 objects 2 2 2 2   Language- repeat 1 1 1 1   Language- follow 3 step command 3 3 3 3   Language- read & follow direction 1 1 1 1   Write a sentence 1 1 1 1   Copy design 1 1 1 1   Total score 23 27 24 28       03/14/2021   10:00 AM  Montreal Cognitive Assessment   Visuospatial/ Executive (0/5) 2  Naming (0/3) 3  Attention: Read list of digits (0/2) 2  Attention: Read list of letters (0/1) 1  Attention: Serial 7 subtraction starting at 100 (0/3) 2  Language: Repeat phrase (0/2) 1  Language : Fluency (0/1) 0  Abstraction (0/2) 1  Delayed Recall (0/5) 0  Orientation (0/6) 4  Total 16  Adjusted Score (based on education) 17   Neuroimaging: Brain MRI on 10/14/2019 revealed mild age-related volume loss without reported lobar predominance. Brain MRI on 03/28/2021 was said to be stable relative to his previous scan. Lumbar puncture performed on 08/14/2022 suggested an elevated p-Tau/Abeta42 ratio (0.033), suggestive of elevated Alzheimer's pathology. Brain MRI on 07/08/2023 suggested mild diffuse cerebral volume loss and evidence for encephalomalacia within the right hippocampus.   Clinical Interview:   The following information was obtained during a clinical interview with Charles Carr prior to cognitive testing.  Cognitive Symptoms: Decreased short-term memory: Previously, Charles Carr described primary difficulties with orientation (e.g., not knowing the date or day of the week). However, he generally attributed this to retirement. His wife reported greater memory dysfunction, particularly surrounding rapid forgetting concerns (e.g., names, recent conversations). Currently, Charles Carr acknowledged trouble recalling names and appeared to not have any recollection of his prior testing appointment in October 2023. He reported his perception of stability if not  mild improvement in memory over time. Medical records have suggested a progressive worsening of cognitive abilities since 2021. Decreased long-term memory: Denied. Decreased attention/concentration: Denied. Reduced processing speed: Denied. Difficulties with executive functions: Denied. He also denied any trouble with impulsivity or significant personality changes.  Difficulties with emotion regulation: Denied. Difficulties with receptive language: Denied. Difficulties with word finding: Denied. Decreased visuoperceptual ability: Denied.   Difficulties completing ADLs: Largely denied. He reported that his wife will commonly lay out his medications. He stated that he is generally able to take these independently. His wife manages finances and bill paying which is longstanding in nature. He denied any current driving concerns.   Additional Medical History: History of traumatic brain injury/concussion: Denied. History of stroke: Denied. History of seizure activity: Denied. History of known exposure to toxins: Denied. Symptoms of chronic pain: Denied. Experience of frequent headaches/migraines: Denied. He and his wife previously alluded to a history of infrequent headache experiences. These were noted to have decreased in frequency over time.  Frequent instances of dizziness/vertigo: Denied.   Sensory changes: He wears glasses with benefit and reported a very longstanding history of anosmia. Other sensory changes/difficulties (e.g., hearing, taste) were denied.  Balance/coordination difficulties: Denied. He also denied any recent falls. Other motor difficulties: Endorsed. He and his wife previously acknowledged a history of mild tremors to the point where they sought out physician opinion regarding Parkinson's disease many years prior. No resting tremor was reported. He reported an occasional postural and action tremor, noting that symptoms generally are present when he does not take an  anti-anxiety medication. Currently, tremors were described as being fairly stable, with Mr. Coopman having good and bad days.   Sleep History: Estimated hours obtained each night: 7-8 hours.  Difficulties falling asleep: Denied. Difficulties staying asleep: Denied. Feels rested and refreshed upon awakening: Endorsed.   History of snoring: Endorsed. History of waking up gasping for air: Denied. Witnessed breath cessation while asleep: Denied.   History of vivid dreaming: Endorsed. Excessive movement while asleep: Endorsed. His wife previously reported a longstanding history of Mr. Satz talking in his sleep and sometimes moving his arms to the extent she is hit while asleep. She did not report any worsening in the frequency or severity of these symptoms over the years.  Instances of acting out his dreams: Denied outside what is described above.   Psychiatric/Behavioral Health History: Depression: He described his current mood as good but did acknowledge some apprehension surrounding progressive memory decline. He denied any prior mental health concerns or diagnoses. Current or remote suicidal ideation, intent, or plan.  Anxiety: Denied. Mania: Denied. Trauma History: Denied. Visual/auditory hallucinations: Denied. Delusional thoughts: Denied.   Tobacco: Denied. Alcohol: He denied current alcohol consumption as well as a history of problematic alcohol abuse or dependence.  Recreational drugs: Denied.  Family History: Problem Relation Age of Onset   Alzheimer's disease Mother    Dementia Mother    Dementia Father        unspecified type   Colon cancer Neg Hx    Esophageal cancer Neg Hx    Rectal cancer Neg Hx    Stomach cancer Neg Hx    Colon polyps Neg Hx    This information was confirmed by Charles Carr.  Academic/Vocational History: Highest level of educational attainment: 12 years. He graduated from high school and described himself as an average (B/C) student in academic  settings. No relative weaknesses were reported.  History of developmental delay: Denied. History of grade repetition: Denied. Enrollment in special education courses: Denied. History of LD/ADHD: Denied.   Employment: Retired. 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 Medical records suggest that his retirement this past March 2023 was at least somewhat related to cognitive dysfunction as there were reports of him becoming overwhelmed by the utilized computer systems.   Evaluation Results:   Behavioral Observations: Mr. Finnan was unaccompanied, arrived to his appointment on time, and was appropriately dressed and groomed. He appeared alert. Observed gait and station were within normal limits. Gross motor functioning appeared intact upon informal observation and no abnormal movements (e.g., tremors) were noted. When directly discussing tremors, he held up both hands and demonstrated a very subtle postural tremor. His affect was generally relaxed and positive. Spontaneous speech was fluent and word finding difficulties were not observed during the clinical interview. Thought processes were coherent, organized, and normal in content. Insight into his cognitive difficulties appeared poor and I do believe Charles Carr has appropriate insight into ongoing cognitive impairment, especially with regard to memory functioning.   During testing, sustained attention was appropriate. Task engagement was adequate and he persisted when challenged. A noticeable tremor was observed during motorized tasks (e.g., clock drawing, figure copy). Overall, Mr. Bazinet was cooperative with the clinical interview and subsequent testing procedures.   Adequacy of Effort: The validity of neuropsychological testing is limited by the extent to which the individual being tested Charles be assumed to have exerted adequate effort during testing. Mr. Whitehorn expressed his intention to perform to  the best  of his abilities and exhibited adequate task engagement and persistence. Scores across stand-alone and embedded performance validity measures were variable. However, his sole below expectation performance is believed to be due to true and severe memory impairment rather than poor engagement. As such, the results of the current evaluation are believed to be a valid representation of Mr. Dejarnette' current cognitive functioning.  Test Results: Charles Carr was fairly disoriented at the time of the current evaluation. He incorrectly stated his age (68). He was unable to provide the street number for his address and incorrectly provided the final four digits of his phone number. He was also unable to state the current date, day of the week, time, or name of the current clinic.  Intellectual abilities based upon educational and vocational attainment were estimated to be in the average range. Premorbid abilities were estimated to be within the well below average range based upon a single-word reading test.   Processing speed was variable, ranging from the well below average to average normative ranges. Basic attention was below average. More complex attention (e.g., working memory) was average. Executive functioning was exceptionally low to well below average.  Assessed receptive language abilities were well below average. Primary difficulties were exhibited when asked to sequence commands, follow multi-step commands, or understand more complex sentence structure. Charles Carr did not appear to exhibit difficulties comprehending task instructions and answered all questions asked of him appropriately. Assessed expressive language was poor. Phonemic fluency was well below average to below average, semantic fluency was exceptionally low, and confrontation naming was exceptionally low.     Assessed visuospatial/visuoconstructional abilities were average to well above average outside of impairment across a clock  drawing task. Points were lost across his drawing of a clock due to him adding the number 0 to the clock, exhibiting errors in numerical placement (i.e., no numbers in the top right quadrant), and incorrect hand placement.     Learning (i.e., encoding) of novel verbal and visual information was variable, ranging from the well below average to average normative ranges. Spontaneous delayed recall (i.e., retrieval) of previously learned information was above average across a shape learning task but exceptionally low across all verbal tasks. Retention rates were 0% across a list learning task, 0% across a story learning task, 0% across a daily living task, and 100% across a shape learning task. Performance across recognition tasks was exceptionally low to below average, suggesting very limited evidence for information consolidation.   Results of emotional screening instruments suggested that recent symptoms of generalized anxiety were in the mild range, while symptoms of depression were within normal limits. A screening instrument assessing recent sleep quality suggested the presence of minimal sleep dysfunction.  Table of Scores:   Note: This summary of test scores accompanies the interpretive report and should not be considered in isolation without reference to the appropriate sections in the text. Descriptors are based on appropriate normative data and Charles be adjusted based on clinical judgment. Terms such as Within Normal Limits and Outside Normal Limits are used when a more specific description of the test score cannot be determined. Descriptors refer to the current evaluation only.        Percentile - Normative Descriptor > 98 - Exceptionally High 91-97 - Well Above Average 75-90 - Above Average 25-74 - Average 9-24 - Below Average 2-8 - Well Below Average < 2 - Exceptionally Low        Validity: October 2023 Current  DESCRIPTOR  DCT: --- --- --- Within Normal Limits  NAB EVI: ---  --- --- Outside Normal Limits        Orientation:       Raw Score Raw Score Percentile   NAB Orientation, Form 1 29/29 20/29 --- ---        Cognitive Screening:       Raw Score Raw Score Percentile   SLUMS: 21/30 13/30 --- ---        Intellectual Functioning:       Standard Score Standard Score Percentile   Barona Formula Estimated Premorbid IQ: 104 104 61 Average         Standard Score Standard Score Percentile   Test of Premorbid Functioning: 78 77 6 Well Below Average        Memory:      NAB Memory Module, Form 1: Standard Score/ T Score Standard Score/ T Score Percentile   Total Memory Index 75 71 3 Well Below Average  List Learning        Total Trials 1-3 12/36 (36) 11/36 (35) 7 Well Below Average    List B 1/12 (33) 3/12 (47) 38 Average    Short Delay Free Recall 0/12 (25) 0/12 (25) 1 Exceptionally Low    Long Delay Free Recall 0/12 (32) 0/12 (32) 4 Well Below Average    Retention Percentage 0 (22) 0 (22) <1 Exceptionally Low    Recognition Discriminability -3 (21) -3 (21) <1 Exceptionally Low  Shape Learning        Total Trials 1-3 15/27 (54) 14/27 (51) 54 Average    Delayed Recall 5/9 (51) 6/9 (57) 75 Above Average    Retention Percentage 71 (42) 100 (50) 50 Average    Recognition Discriminability 5 (42) 5 (42) 21 Below Average  Story Learning        Immediate Recall 34/80 (34) 26/80 (29) 2 Well Below Average    Delayed Recall 9/40 (35) 0/40 (34) 5 Well Below Average    Retention Percentage 45 (31) 0 (<19) <1 Exceptionally Low  Daily Living Memory        Immediate Recall 30/51 (39) 30/51 (39) 14 Below Average    Delayed Recall 6/17 (31) 0/17 (19) <1 Exceptionally Low    Retention Percentage 50 (28) 0 (<19) <1 Exceptionally Low    Recognition Hits 6/10 (28) 6/10 (28) 2 Well Below Average        Attention/Executive Function:      Trail Making Test (TMT): Raw Score (T Score) Raw Score (T Score) Percentile     Part A 52 secs.,  0 errors (41) 52 secs.,  0 errors  (41) 18 Below Average    Part B 189 secs.,  3 errors (38) 235 secs.,  0 errors (33) 5 Well Below Average          Scaled Score Scaled Score Percentile   WAIS-IV Coding: 6 4 2  Well Below Average        NAB Attention Module, Form 1: T Score T Score Percentile     Digits Forward 50 42 21 Below Average    Digits Backwards 39 51 54 Average         Scaled Score Scaled Score Percentile   WAIS-IV Similarities: 8 5 5  Well Below Average        D-KEFS Color-Word Interference Test: Raw Score (Scaled Score) Raw Score (Scaled Score) Percentile     Color Naming 38 secs. (8) 38 secs. (8) 25 Average    Word  Reading 25 secs. (10) 26 secs. (10) 50 Average    Inhibition 80 secs. (9) 169 secs. (1) <1 Exceptionally Low      Total Errors 0 errors (13) 15 errors (1) <1 Exceptionally Low    Inhibition/Switching 94 secs. (8) Discontinued --- Impaired      Total Errors 4 errors (9) --- --- ---        D-KEFS Verbal Fluency Test: Raw Score (Scaled Score) Raw Score (Scaled Score) Percentile     Letter Total Correct 23 (6) 20 (6) 9 Below Average    Category Total Correct 26 (7) 16 (3) 1 Exceptionally Low    Category Switching Total Correct 8 (5) 8 (5) 5 Well Below Average    Category Switching Accuracy 7 (6) 7 (6) 9 Below Average      Total Set Loss Errors 2 (10) 1 (11) 63 Average      Total Repetition Errors 10 (3) 5 (8) 25 Average        Language:      Verbal Fluency Test: Raw Score (T Score) Raw Score (T Score) Percentile     Phonemic Fluency (FAS) 23 (38) 20 (34) 5 Well Below Average    Animal Fluency 14 (42) 7 (21) <1 Exceptionally Low         NAB Language Module, Form 1: T Score T Score Percentile     Auditory Comprehension 44 35 7 Well Below Average    Naming 27/31 (37) 20/31 (19) <1 Exceptionally Low        Visuospatial/Visuoconstruction:       Raw Score Raw Score Percentile   Clock Drawing: 9/10 5/10 --- Impaired        NAB Spatial Module, Form 1: T Score T Score Percentile     Figure  Drawing Copy 64 67 96 Well Above Average         Scaled Score Scaled Score Percentile   WAIS-IV Block Design: 11 8 25  Average        Mood and Personality:       Raw Score Raw Score Percentile   Geriatric Depression Scale: 8 7 --- Within Normal Limits  Geriatric Anxiety Scale: 3 17 --- Mild    Somatic 0 4 --- Minimal    Cognitive 1 7 --- Moderate    Affective 2 6 --- Mild        Additional Questionnaires:       Raw Score Raw Score Percentile   PROMIS Sleep Disturbance Questionnaire: 14 10 --- None to Slight   Informed Consent and Coding/Compliance:   The current evaluation represents a clinical evaluation for the purposes previously outlined by the referral source and is in no way reflective of a forensic evaluation.   Mr. Bernard was provided with a verbal description of the nature and purpose of the present neuropsychological evaluation. Also reviewed were the foreseeable risks and/or discomforts and benefits of the procedure, limits of confidentiality, and mandatory reporting requirements of this provider. The patient was given the opportunity to ask questions and receive answers about the evaluation. Oral consent to participate was provided by the patient.   This evaluation was conducted by Arthea KYM Maryland, Ph.D., ABPP-CN, board certified clinical neuropsychologist. Mr. Gilham completed a clinical interview with Dr. Maryland, billed as one unit [phone], and 165 minutes of cognitive testing and scoring, billed as one unit [phone] and five additional units 96139. Psychometrist Lonell Jude, B.S. assisted Dr. Maryland with test administration and scoring procedures. As a separate and  discrete service, one unit [phone] and two units 96133 (160 minutes) were billed for Dr. Loralee time spent in interpretation and report writing.

## 2023-08-30 ENCOUNTER — Encounter: Admitting: Psychology

## 2023-09-04 ENCOUNTER — Ambulatory Visit: Admitting: Psychology

## 2023-09-04 DIAGNOSIS — F0280 Dementia in other diseases classified elsewhere without behavioral disturbance: Secondary | ICD-10-CM

## 2023-09-04 DIAGNOSIS — G309 Alzheimer's disease, unspecified: Secondary | ICD-10-CM

## 2023-09-04 NOTE — Progress Notes (Signed)
   Neuropsychology Feedback Session Jolynn DEL. [facility] Department of Neurology  Reason for Referral:   Charles Carr is a 72 y.o. right-handed Caucasian male referred by Camie Sevin, PA-C, to characterize his current cognitive functioning and assist with diagnostic clarity and treatment planning in the context of a prior mild neurocognitive disorder diagnosis and concern for progressive cognitive decline.   Feedback:   Mr. Furney completed a comprehensive neuropsychological evaluation on 08/21/2023. Please refer to that encounter for the full report and recommendations. Briefly, results suggested severe impairment surrounding all aspects of verbal learning and memory. Additional impairment was exhibited across executive functioning, receptive language, semantic fluency, confrontation naming, and clock drawing. Further variability was exhibited across processing speed and phonemic fluency. Relative to his previous evaluation in October 2023, greatest areas of decline were exhibited across confrontation naming, executive functioning, clock drawing, and semantic fluency. Receptive language and verbal memory (retention rates more specifically) also saw mild decline. Other assessed domains exhibited relative stability. Regarding the cause for ongoing memory impairment, concerns remain for underlying Alzheimer's disease. Across memory testing, Mr. Wieman was fully amnestic (i.e., 0% retention) across all verbal tasks after a brief delay. He also performed poorly across recognition trials, suggesting evidence for rapid forgetting and a prominent storage impairment, both of which are the hallmark testing patterns of this illness. Further impairments surrounding semantic fluency, confrontation naming, clock drawing, and executive functioning would follow the expected progression of this illness as it spreads. His lumbar puncture in July 2024 was concerning for underlying Alzheimer's disease  pathology. Overall, the combination of testing patterns, progressive decline, and laboratory testing (lumbar puncture) does suggest underlying Alzheimer's disease driving memory impairment and cognitive decline being most likely. Continued medical monitoring will be important moving forward.   Mr. Britten was accompanied by his wife during the current feedback session. Content of the current session focused on the results of his neuropsychological evaluation. Mr. Dantonio was given the opportunity to ask questions and his questions were answered. He was encouraged to reach out should additional questions arise. A copy of his report was provided at the conclusion of the visit.      One unit 96132 (31 minutes) was billed for Dr. Loralee time spent preparing for, conducting, and documenting the current feedback session with Mr. Mells.

## 2023-12-12 ENCOUNTER — Institutional Professional Consult (permissible substitution): Payer: Medicare HMO | Admitting: Psychology

## 2023-12-12 ENCOUNTER — Ambulatory Visit: Payer: Self-pay

## 2023-12-21 ENCOUNTER — Encounter: Payer: Medicare HMO | Admitting: Psychology

## 2023-12-31 ENCOUNTER — Encounter: Payer: Self-pay | Admitting: Physician Assistant

## 2024-01-02 NOTE — Progress Notes (Signed)
 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 Patient is currently on donepezil  23 mg daily and memantine  5 mg twice daily.  In view of the recent diagnosis, we discussed increasing memantine  for broader coverage, wife agrees to proceed.  Patient is able to participate on ADLs, no longer drives.  There are some changes in his mood, possible auditory hallucinations.       Follow up in  6 months. Continue donepezil  23 mg daily and increase memantine  to 10 mg twice daily. 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 No hallucinations or significant mood changes. Daytime sleepiness likely due to lack of stimulation. Current treatment includes donepezil  and memantine .  - Increase memantine  to 10 mg twice a day. - Continue donepezil  23 mg daily. - Encouraged participation in memory-stimulating activities. - Ensure adequate sleep at night and consider adult day programs for increased daytime stimulation. - Continue monitoring mood with primary care physician, with potential for Lexapro adjustment if needed.  Mild tremor Unchanged from previous visit.       Subjective:    This patient is accompanied in the office by his wife who supplements the history.  Previous records as well as any outside records available were reviewed prior to todays visit. Patient was last seen on 06/28/2023 with MMSE 23/30    Discussed the use of AI scribe software for clinical note transcription with the  patient, who gave verbal consent to proceed.  History of Present Illness Charles Carr is a 73 year old male with mild dementia who presents for a follow-up on memory concerns. He is accompanied by his wife, who is his primary caregiver.  Cognitive impairment - Progressive worsening of memory since May - Increased forgetfulness and difficulty understanding directions - Difficulty recognizing rooms, such as the laundry room - Requires frequent reminders for daily tasks, including taking the dog out and helping with household chores - Tends to follow his wife around the house - Does not manage his own finances; wife handles financial matters - Prefers his wife to make decisions for him  Activities of daily living (adls) and instrumental activities of daily living (iadls) - Able to perform basic tasks such as feeding himself and walking the dog - Requires reminders to change clothes - Needs assistance with medication management; wife prepares doses each morning - No issues with eating or remembering to eat  Sleep disturbance - Frequent daytime napping - Good sleep quality at night - Talks in his sleep most nights, but is unaware of this  Hydration and appetite - Good appetite - Does not drink enough water unless prompted by his wife  Movement abnormalities - Mild tremors and involuntary mouth movements, unchanged from previous visits  Urinary and gastrointestinal function - No urinary incontinence - No diarrhea or constipation  Pain and falls - No major pain - No recent falls or head injuries    LP in 07/2022, pTau/Abeta42 ratio was abnormal at 0.033, consistent with the presence of Alzheimer's disease    MRI  of the brain 07/22/2023 remarkable for mild diffuse cerebral volume loss and encephalomalacia of the right hippocampus   Initial Visit 03/14/21 The patient is seen in neurologic consultation at the request of Gunter, Tara G, NP for the evaluation of memory.  The patient is  accompanied by his wife who supplements the history. This is a 73 y.o. year old RH  male who has had memory issues for about 2 years, when he began noticing he could not remember what his wife had told him in the recent conversation.  He also began to forget his on passwords, symptoms that had been worsening over the last year.  He initially visited his PCP in August 2021 with these complaints, had an MRI of the brain which was showing mild volume changes, otherwise negative.  His PCP placed him on donepezil  5 mg daily in March 2022, but as his memory changes were more evident, as of December 2022 this was increased to 10 mg daily.  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  Despite cauterization, he continued to have problems with short-term memory.  He denies leaving objects in unusual places.  He continues to drive and denies getting lost.  He denies any mood changes or depression.  He does have a history of GAD, well controlled with antianxiety medications.  He is not very active, never exercised much but recently he got a dog, and he is walking on a daily basis.  He does not like to read, do crossword puzzles or word finding.  He sleeps well.  His wife reports that for the last 10 years, he has been acting out in his dreams, at times moving his hands, at one point it was worse, is better now since the pill .  He denies hallucinations or paranoia.  There are no hygiene concerns, he is independent of bathing and dressing.  He is in charge of the medications, and denies missing any doses.  He is also in charge of the bills, and denies missing any payments.  He retired as a estate manager/land agent for a scientist, research (life sciences) once the computer systems became overwhelming to him.  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  His appetite is not as  good as it was before according to him.  He denies trouble swallowing.  He does not cook.  He ambulates without difficulty, denies any falls or ever having a head injury.  He has intermittent, chronic frontal headaches, about twice a week, takes Tylenol  with resolution of it.  No photophobia, nausea or vomiting with it.  He denies double vision, dizziness, focal numbness, tingling, unilateral weakness, or anosmia.  He had COVID in March 2022.  He has mild right intention tremors, present for several years.  No history of seizures.  He denies urine incontinence, retention, constipation or diarrhea.  He denies sleep apnea, alcohol or tobacco history.  Family history remarkable for mother and father with Alzheimer's disease.  Neuropsych evaluation 08/21/2023, Dr. Richie Briefly, results suggested severe impairment surrounding all aspects of verbal learning and memory. Additional impairment was exhibited across executive functioning, receptive language, semantic fluency, confrontation naming, and clock drawing. Further variability was exhibited across processing speed and phonemic fluency. Relative to his previous evaluation in October 2023, greatest areas of decline were exhibited across confrontation naming,  executive functioning, clock drawing, and semantic fluency. Receptive language and verbal memory (retention rates more specifically) also saw mild decline. Other assessed domains exhibited relative stability. Regarding the cause for ongoing memory impairment, concerns remain for underlying Alzheimer's disease. Across memory testing, Mr. Schreifels was fully amnestic (i.e., 0% retention) across all verbal tasks after a brief delay. He also performed poorly across recognition trials, suggesting evidence for rapid forgetting and a prominent storage impairment, both of which are the hallmark testing patterns of this illness. Further impairments surrounding semantic fluency, confrontation naming, clock drawing, and executive  functioning would follow the expected progression of this illness as it spreads. His lumbar puncture in July 2024 was concerning for underlying Alzheimer's disease pathology. Overall, the combination of testing patterns, progressive decline, and laboratory testing (lumbar puncture) does suggest underlying Alzheimer's disease driving memory impairment and cognitive decline being most likely. Continued medical monitoring will be important moving forward.     PREVIOUS MEDICATIONS:   CURRENT MEDICATIONS:  Outpatient Encounter Medications as of 01/03/2024  Medication Sig   acetaminophen  (TYLENOL ) 500 MG tablet Take 1,000 mg by mouth every 6 (six) hours as needed (pain).   donepezil  (ARICEPT ) 23 MG TABS tablet Take 1 tablet (23 mg total) by mouth at bedtime.   escitalopram (LEXAPRO) 10 MG tablet Take by mouth.   ferrous sulfate 325 (65 FE) MG tablet Take 325 mg by mouth in the morning.   memantine  (NAMENDA ) 10 MG tablet Take 1 twice a day   omeprazole  (PRILOSEC) 40 MG capsule Take at least 30 minutes before breakfast on an empty stomach.  Patient needs follow up appointment for future refills. Please call [phone] to schedule an appointment.   rosuvastatin (CRESTOR) 5 MG tablet Take 5 mg by mouth in the morning.   [DISCONTINUED] memantine  (NAMENDA ) 5 MG tablet Take 1 tablet (5 mg at night) for 2 weeks, then increase to 1 tablet (5 mg) twice a day   No facility-administered encounter medications on file as of 01/03/2024.       06/28/2023    1:00 PM 12/30/2022    4:00 PM 07/07/2022   12:00 PM  MMSE - Mini Mental State Exam  Orientation to time 3 5 2   Orientation to Place 4 5 5   Registration 3 3 3   Attention/ Calculation 4 5 4   Recall 0 0 1  Language- name 2 objects 2 2 2   Language- repeat 1 1 1   Language- follow 3 step command 3 3 3   Language- read & follow direction 1 1 1   Write a sentence 1 1 1   Copy design 1 1 1   Total score 23 27 24       03/14/2021   10:00 AM  [facility]  Cognitive Assessment   Visuospatial/ Executive (0/5) 2  Naming (0/3) 3  Attention: Read list of digits (0/2) 2  Attention: Read list of letters (0/1) 1  Attention: Serial 7 subtraction starting at 100 (0/3) 2  Language: Repeat phrase (0/2) 1  Language : Fluency (0/1) 0  Abstraction (0/2) 1  Delayed Recall (0/5) 0  Orientation (0/6) 4  Total 16  Adjusted Score (based on education) 17    Objective:    Neurological Exam:    VITALS:   Vitals:   01/03/24 1042  BP: (!) 143/81  Pulse: (!) 55  Resp: 20  SpO2: 98%  Weight: 174 lb (78.9 kg)  Height: 5' 8 (1.727 m)    GEN:  The patient appears stated age and is in NAD. HEENT:  Normocephalic, atraumatic.   Neurological examination:  General: NAD, well-groomed, appears stated age. Orientation: The patient is alert. Oriented to person, place and date Cranial nerves: There is good facial symmetry.The speech is fluent and clear. No aphasia or dysarthria. Fund of knowledge is appropriate. Recent and remote memory are impaired. Attention and concentration are reduced. Able to name objects and repeat phrases.  Hearing is intact to conversational tone.   Sensation: Sensation is intact to light touch throughout Motor: Strength is at least antigravity x4. DTR's 2/4 in UE/LE     Movement examination: Tone: There is normal tone in the UE/LE.  No cogwheeling Abnormal movements: Very mild intention tremor both hands.  No myoclonus.  No asterixis.   Coordination:  There is no decremation with RAM's. Normal finger to nose  Gait and Station: The patient has no difficulty arising out of a deep-seated chair without the use of the hands. [name] stride length is good.  Gait is cautious and narrow.    Thank you for allowing us  the opportunity to participate in the care of this nice patient. Please do not hesitate to contact us  for any questions or concerns.   Total time spent on today's visit was 35 minutes dedicated to this patient today,  preparing to see patient, examining the patient, ordering tests and/or medications and counseling the patient, documenting clinical information in the EHR or other health record, independently interpreting results and communicating results to the patient/family, discussing treatment and goals, answering patient's questions and coordinating care.  Cc:  Brown-Patram, Melissa Joyce, NP  Camie Sevin 01/03/2024 12:29 PM

## 2024-01-03 ENCOUNTER — Encounter: Payer: Self-pay | Admitting: Physician Assistant

## 2024-01-03 ENCOUNTER — Ambulatory Visit: Admitting: Physician Assistant

## 2024-01-03 VITALS — BP 143/81 | HR 55 | Resp 20 | Ht 68.0 in | Wt 174.0 lb

## 2024-01-03 DIAGNOSIS — G309 Alzheimer's disease, unspecified: Secondary | ICD-10-CM

## 2024-01-03 DIAGNOSIS — F0280 Dementia in other diseases classified elsewhere without behavioral disturbance: Secondary | ICD-10-CM | POA: Diagnosis not present

## 2024-01-03 MED ORDER — MEMANTINE HCL 10 MG PO TABS: ORAL_TABLET | ORAL | 3 refills | Status: AC

## 2024-01-03 NOTE — Patient Instructions (Addendum)
 It was a pleasure to see you today at our office.   Recommendations:   Increase Memantine  10 mg tablets  twice daily.   Continue donepezil  23 mg daily. Side effects were discussed  Follow up in 6 months Recommend visiting the website :  Dementia Success Path to better understand some behaviors related to memory loss.      [email or web address]://[email or web address]/resource/neuro-rehabilitation-apps-[email or web address]-games/   RECOMMENDATIONS FOR ALL PATIENTS WITH MEMORY PROBLEMS: 1. Continue to exercise (Recommend 30 minutes of walking everyday, or 3 hours every week) 2. Increase social interactions - continue going to [employer] and enjoy social gatherings with friends and family 3. Eat healthy, avoid fried foods and eat more fruits and vegetables 4. Maintain adequate blood pressure, blood sugar, and blood cholesterol level. Reducing the risk of stroke and cardiovascular disease also helps promoting better memory. 5. Avoid stressful situations. Live a simple life and avoid aggravations. Organize your time and prepare for the next day in anticipation. 6. Sleep well, avoid any interruptions of sleep and avoid any distractions in the bedroom that may interfere with adequate sleep quality 7. Avoid sugar, avoid sweets as there is a strong link between excessive sugar intake, diabetes, and cognitive impairment We discussed the Mediterranean diet, which has been shown to help patients reduce the risk of progressive memory disorders and reduces cardiovascular risk. This includes eating fish, eat fruits and green leafy vegetables, nuts like almonds and hazelnuts, walnuts, and also use olive oil. Avoid fast foods and fried foods as much as possible. Avoid sweets and sugar as sugar use has been linked to worsening of memory function.  There is always a concern of gradual progression of memory problems. If this is the case, then we may need to adjust level of care according to patient needs. Support, both to the patient and caregiver,  should then be put into place.           FALL PRECAUTIONS: Be cautious when walking. Scan the area for obstacles that may increase the risk of trips and falls. When getting up in the mornings, sit up at the edge of the bed for a few minutes before getting out of bed. Consider elevating the bed at the head end to avoid drop of blood pressure when getting up. Walk always in a well-lit room (use night lights in the walls). Avoid area rugs or power cords from appliances in the middle of the walkways. Use a walker or a cane if necessary and consider physical therapy for balance exercise. Get your eyesight checked regularly.  FINANCIAL OVERSIGHT: Supervision, especially oversight when making financial decisions or transactions is also recommended.  HOME SAFETY: Consider the safety of the kitchen when operating appliances like stoves, microwave oven, and blender. Consider having supervision and share cooking responsibilities until no longer able to participate in those. Accidents with firearms and other hazards in the house should be identified and addressed as well.   ABILITY TO BE LEFT ALONE: If patient is unable to contact 911 operator, consider using [employer], or when the need is there, arrange for someone to stay with patients. Smoking is a fire hazard, consider supervision or cessation. Risk of wandering should be assessed by caregiver and if detected at any point, supervision and safe proof recommendations should be instituted.  MEDICATION SUPERVISION: Inability to self-administer medication needs to be constantly addressed. Implement a mechanism to ensure safe administration of the medications.      Mediterranean Diet A Mediterranean diet refers to food and lifestyle choices that are  based on the traditions of countries located on the [place]. This way of eating has been shown to help prevent certain conditions and improve outcomes for people who have chronic diseases, like kidney  disease and heart disease. What are tips for following this plan? Lifestyle  Cook and eat meals together with your family, when possible. Drink enough fluid to keep your urine clear or pale yellow. Be physically active every day. This includes: Aerobic exercise like running or swimming. Leisure activities like gardening, walking, or housework. Get 7-8 hours of sleep each night. If recommended by your health care provider, drink red wine in moderation. This means 1 glass a day for nonpregnant women and 2 glasses a day for men. A glass of wine equals 5 oz (150 mL). Reading food labels  Check the serving size of packaged foods. For foods such as rice and pasta, the serving size refers to the amount of cooked product, not dry. Check the total fat in packaged foods. Avoid foods that have saturated fat or trans fats. Check the ingredients list for added sugars, such as corn syrup. Shopping  At the grocery store, buy most of your food from the areas near the walls of the store. This includes: Fresh fruits and vegetables (produce). Grains, beans, nuts, and seeds. Some of these may be available in unpackaged forms or large amounts (in bulk). Fresh seafood. Poultry and eggs. Low-fat dairy products. Buy whole ingredients instead of prepackaged foods. Buy fresh fruits and vegetables in-season from local farmers markets. Buy frozen fruits and vegetables in resealable bags. If you do not have access to quality fresh seafood, buy precooked frozen shrimp or canned fish, such as tuna, salmon, or sardines. Buy small amounts of raw or cooked vegetables, salads, or olives from the deli or salad bar at your store. Stock your pantry so you always have certain foods on hand, such as olive oil, canned tuna, canned tomatoes, rice, pasta, and beans. Cooking  Cook foods with extra-virgin olive oil instead of using butter or other vegetable oils. Have meat as a side dish, and have vegetables or grains as your main  dish. This means having meat in small portions or adding small amounts of meat to foods like pasta or stew. Use beans or vegetables instead of meat in common dishes like chili or lasagna. Experiment with different cooking methods. Try roasting or broiling vegetables instead of steaming or sauteing them. Add frozen vegetables to soups, stews, pasta, or rice. Add nuts or seeds for added healthy fat at each meal. You can add these to yogurt, salads, or vegetable dishes. Marinate fish or vegetables using olive oil, lemon juice, garlic, and fresh herbs. Meal planning  Plan to eat 1 vegetarian meal one day each week. Try to work up to 2 vegetarian meals, if possible. Eat seafood 2 or more times a week. Have healthy snacks readily available, such as: Vegetable sticks with hummus. Greek yogurt. Fruit and nut trail mix. Eat balanced meals throughout the week. This includes: Fruit: 2-3 servings a day Vegetables: 4-5 servings a day Low-fat dairy: 2 servings a day Fish, poultry, or lean meat: 1 serving a day Beans and legumes: 2 or more servings a week Nuts and seeds: 1-2 servings a day Whole grains: 6-8 servings a day Extra-virgin olive oil: 3-4 servings a day Limit red meat and sweets to only a few servings a month What are my food choices? Mediterranean diet Recommended Grains: Whole-grain pasta. Brown rice. Bulgar wheat. Polenta. Couscous. Whole-wheat  bread. [place] Made[place]. Vegetables: Artichokes. Beets. Broccoli. Cabbage. Carrots. Eggplant. Green beans. Chard. Kale. Spinach. Onions. Leeks. Peas. Squash. Tomatoes. Peppers. Radishes. Fruits: Apples. Apricots. Avocado. Berries. Bananas. Cherries. Dates. Figs. Grapes. Lemons. Melon. Oranges. Peaches. Plums. Pomegranate. Meats and other protein foods: Beans. Almonds. Sunflower seeds. Pine nuts. Peanuts. Cod. Salmon. Scallops. Shrimp. Tuna. Tilapia. Clams. Oysters. Eggs. Dairy: Low-fat milk. Cheese. Greek yogurt. Beverages: Water. Red wine.  Herbal tea. Fats and oils: Extra virgin olive oil. Avocado oil. Grape seed oil. Sweets and desserts: Greek yogurt with honey. Baked apples. Poached pears. Trail mix. Seasoning and other foods: Basil. Cilantro. Coriander. Cumin. Mint. Parsley. Sage. Rosemary. Tarragon. Garlic. Oregano. Thyme. Pepper. Balsalmic vinegar. Tahini. Hummus. Tomato sauce. Olives. Mushrooms. Limit these Grains: Prepackaged pasta or rice dishes. Prepackaged cereal with added sugar. Vegetables: Deep fried potatoes (french fries). Fruits: Fruit canned in syrup. Meats and other protein foods: Beef. Pork. Lamb. Poultry with skin. Hot dogs. [place]. Dairy: Ice cream. Sour cream. Whole milk. Beverages: Juice. Sugar-sweetened soft drinks. Beer. Liquor and spirits. Fats and oils: Butter. Canola oil. Vegetable oil. Beef fat (tallow). Lard. Sweets and desserts: Cookies. Cakes. Pies. Candy. Seasoning and other foods: Mayonnaise. Premade sauces and marinades. The items listed may not be a complete list. Talk with your dietitian about what dietary choices are right for you. Summary The Mediterranean diet includes both food and lifestyle choices. Eat a variety of fresh fruits and vegetables, beans, nuts, seeds, and whole grains. Limit the amount of red meat and sweets that you eat. Talk with your health care provider about whether it is safe for you to drink red wine in moderation. This means 1 glass a day for nonpregnant women and 2 glasses a day for men. A glass of wine equals 5 oz (150 mL). This information is not intended to replace advice given to you by your health care provider. Make sure you discuss any questions you have with your health care provider. Document Released: 09/23/2015 Document Revised: 10/26/2015 Document Reviewed: 09/23/2015 Elsevier Interactive Patient Education  2017 [employer].

## 2024-07-03 ENCOUNTER — Ambulatory Visit: Admitting: Physician Assistant
